# Patient Record
Sex: Female | Born: 1990 | Race: Black or African American | Hispanic: No | Marital: Single | Smoking: Never smoker
Health system: Southern US, Community
[De-identification: ages and names within clinical notes are randomized; demographics above are authoritative.]

## PROBLEM LIST (undated history)

## (undated) DIAGNOSIS — F3281 Premenstrual dysphoric disorder: Secondary | ICD-10-CM

## (undated) DIAGNOSIS — J45909 Unspecified asthma, uncomplicated: Secondary | ICD-10-CM

## (undated) HISTORY — PX: DILATION AND CURETTAGE OF UTERUS: SHX78

---

## 2011-07-28 ENCOUNTER — Other Ambulatory Visit (HOSPITAL_COMMUNITY)
Admission: RE | Admit: 2011-07-28 | Discharge: 2011-07-28 | Disposition: A | Discharge status: Home / Self Care | Payer: PRIVATE HEALTH INSURANCE | Source: Ambulatory Visit | Attending: Obstetrics and Gynecology | Admitting: Obstetrics and Gynecology

## 2011-07-28 DIAGNOSIS — Z113 Encounter for screening for infections with a predominantly sexual mode of transmission: Secondary | ICD-10-CM | POA: Insufficient documentation

## 2011-07-28 DIAGNOSIS — Z124 Encounter for screening for malignant neoplasm of cervix: Secondary | ICD-10-CM | POA: Insufficient documentation

## 2014-03-01 ENCOUNTER — Emergency Department (HOSPITAL_BASED_OUTPATIENT_CLINIC_OR_DEPARTMENT_OTHER): Payer: No Typology Code available for payment source

## 2014-03-01 ENCOUNTER — Encounter (HOSPITAL_BASED_OUTPATIENT_CLINIC_OR_DEPARTMENT_OTHER): Payer: Self-pay | Admitting: *Deleted

## 2014-03-01 ENCOUNTER — Emergency Department (HOSPITAL_BASED_OUTPATIENT_CLINIC_OR_DEPARTMENT_OTHER)
Admission: EM | Admit: 2014-03-01 | Discharge: 2014-03-01 | Disposition: A | Discharge status: Home / Self Care | Payer: No Typology Code available for payment source | Attending: Emergency Medicine | Admitting: Emergency Medicine

## 2014-03-01 DIAGNOSIS — Y998 Other external cause status: Secondary | ICD-10-CM | POA: Insufficient documentation

## 2014-03-01 DIAGNOSIS — Y92481 Parking lot as the place of occurrence of the external cause: Secondary | ICD-10-CM | POA: Insufficient documentation

## 2014-03-01 DIAGNOSIS — S43402A Unspecified sprain of left shoulder joint, initial encounter: Secondary | ICD-10-CM

## 2014-03-01 DIAGNOSIS — Y9389 Activity, other specified: Secondary | ICD-10-CM | POA: Diagnosis not present

## 2014-03-01 DIAGNOSIS — S39012A Strain of muscle, fascia and tendon of lower back, initial encounter: Secondary | ICD-10-CM | POA: Diagnosis not present

## 2014-03-01 DIAGNOSIS — S43409A Unspecified sprain of unspecified shoulder joint, initial encounter: Secondary | ICD-10-CM | POA: Insufficient documentation

## 2014-03-01 DIAGNOSIS — S199XXA Unspecified injury of neck, initial encounter: Secondary | ICD-10-CM | POA: Diagnosis present

## 2014-03-01 DIAGNOSIS — S161XXA Strain of muscle, fascia and tendon at neck level, initial encounter: Secondary | ICD-10-CM | POA: Insufficient documentation

## 2014-03-01 DIAGNOSIS — R52 Pain, unspecified: Secondary | ICD-10-CM

## 2014-03-01 MED ORDER — HYDROCODONE-ACETAMINOPHEN 5-325 MG PO TABS
1.0000 | ORAL_TABLET | Freq: Once | ORAL | Status: AC
  Administered 2014-03-01: 1 via ORAL
  Filled 2014-03-01: qty 1

## 2014-03-01 MED ORDER — HYDROCODONE-ACETAMINOPHEN 5-325 MG PO TABS: ORAL_TABLET | ORAL | Status: DC
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
## 2014-03-01 NOTE — ED Notes (Signed)
Pt's heart rate was 48 on d/c. PA updated and an EKG ordered. Primary nurse updated.

## 2014-03-01 NOTE — Discharge Instructions (Signed)
Take vicodin for breakthrough pain, do not drink alcohol, drive, care for children or do other critical tasks while taking vicodin. ° °Do not hesitate to return to the emergency room for any new, worsening or concerning symptoms. ° °Please obtain primary care using resource guide below. But the minute you were seen in the emergency room and that they will need to obtain records for further outpatient management. ° ° °Emergency Department Resource Guide °1) Find a Doctor and Pay Out of Pocket °Although you won't have to find out who is covered by your insurance plan, it is a good idea to ask around and get recommendations. You will then need to call the office and see if the doctor you have chosen will accept you as a new patient and what types of options they offer for patients who are self-pay. Some doctors offer discounts or will set up payment plans for their patients who do not have insurance, but you will need to ask so you aren't surprised when you get to your appointment. ° °2) Contact Your Local Health Department °Not all health departments have doctors that can see patients for sick visits, but many do, so it is worth a call to see if yours does. If you don't know where your local health department is, you can check in your phone book. The CDC also has a tool to help you locate your state's health department, and many state websites also have listings of all of their local health departments. ° °3) Find a Walk-in Clinic °If your illness is not likely to be very severe or complicated, you may want to try a walk in clinic. These are popping up all over the country in pharmacies, drugstores, and shopping centers. They're usually staffed by nurse practitioners or physician assistants that have been trained to treat common illnesses and complaints. They're usually fairly quick and inexpensive. However, if you have serious medical issues or chronic medical problems, these are probably not your best option. ° °No  Primary Care Doctor: °- Call [facility] at  832-8000 - they can help you locate a primary care doctor that  accepts your insurance, provides certain services, etc. °- Physician Referral Service- 1-800-533-3463 ° °Chronic Pain Problems: °Organization         Address  Phone   Notes  °[facility]  (336) 297-2271 Patients need to be referred by their primary care doctor.  ° °Medication Assistance: °Organization         Address  Phone   Notes  °[place] [facility] [address] °[place], [place] [address] (336) 641-8030 --Must be a resident of [place] °-- Must have NO insurance coverage whatsoever (no Medicaid/ Medicare, etc.) °-- The pt. MUST have a primary care doctor that directs their care regularly and follows them in the community °  °[facility]  (866) 331-1348   °[employer]  (888) 892-1162   ° °Agencies that provide inexpensive medical care: °Organization         Address  Phone   Notes  °[facility]  (336) 832-8035   °[facility]    (336) 832-7272   °[facility] [address] °[place], [place] [address] (336) 832-4777   °[facility] [address], °[place] (336) 271-4999   °[facility]    (336) 373-0678   °[facility]    (336) 272-1050   °[facility] °   Center ° [address], [place] Phone:  (336) 832-4444, Fax:  (336) 832-4440 Hours of Operation:  9 am - 6 pm, M-F.  Also accepts Medicaid/Medicare and self-pay.  °[facility] ° [address], [place] Phone: (336) 832-3150, Fax: (336) 832-3151. Hours of Operation:  8:30 am - 5:30 pm, M-F.  Also accepts Medicaid and self-pay.  °[facility] [place] [address], [place] Phone: (336) 878-6027   °[facility] [address], [place], [place] (336)723-1848, Ext. 123 Mondays & Thursdays: 7-9 AM.  First 15  patients are seen on a first come, first serve basis. °  ° °Medicaid-accepting [place] Providers: ° °Organization         Address  Phone   Notes  °[facility] [address], [place] (336) 641-2100 Also accepts self-pay patients.  °[facility] [address], [place] ° (336) 856-9996   °[facility] [address], [place] (336) 288-8857   °[facility] [address] [place] [address], [place] (336) 299-7000   °Veita Bland [address], [place]  ° (336) 373-1557 Only accepts [place] Access Medicaid patients after they have their name applied to their card.  ° °Self-Pay (no insurance) in [place]: ° °Organization         Address  Phone   Notes  °Sickle Cell Patients, [facility] [address], [place] (336) 832-1970   °[facility] [address], [place] (336) 832-4400   °[facility] ° [address], [place] (336) 992-4800   °[facility]/Dr. Osei-Bonsu ° [address] [place] [address], [place] or [address], [place] (336) 841-8500 Phone number for both [place] and [place] locations is the same.  °[facility] [address], [place] (336) 299-0000   °[facility] [address] [place] [address], [place] or [address] (336) 852-7530 °(336) 878-2260   °[facility] [address], [place] (336) 350-1642, phone; (336) 294-5005, fax Sees patients 1st and 3rd Saturday of every month.  Must not qualify for public or private insurance (i.e. Medicaid, Medicare, [place] Health Choice, Veterans' Benefits) • Household income should be no more than 200% of the poverty level •The clinic cannot treat you if you are pregnant or think you are pregnant • Sexually transmitted diseases are not treated at the clinic.  ° ° °Dental  Care: °Organization         Address  Phone  Notes  °[place] [facility] [address], [place] (336) 641-6152 Accepts children up to age 21 who are enrolled in Medicaid or [place] Health Choice; pregnant women with a Medicaid card; and children who have applied for Medicaid or [place] Health Choice, but were declined, whose parents can pay a reduced fee at time of service.  °[place] [facility] [place]  [address], [place] (336) 641-7733 Accepts children up to age 21 who are enrolled in Medicaid or [place] Health Choice; pregnant women with a Medicaid card; and children who have applied for Medicaid or [place] Health Choice, but were declined, whose parents can pay a reduced fee at time of service.  °[facility] ° [address], [place] (  336) Z1729269 Patients are seen by appointment only. Walk-ins are not accepted. [employer] will see patients 71 years of age and older. Monday - Tuesday (8am-5pm) Most Wednesdays (8:30-5pm) $30 per visit, cash only  [facility]  [address], [facility] [phone] Patients are seen by appointment only. Walk-ins are not accepted. [place] will see patients 37 years of age and older. One Wednesday Evening (Monthly: Volunteer Based).  $30 per visit, cash only  [place]  [phone] for adults; Children under age 71, call Graduate Pediatric Dentistry at [phone]. Children aged 47-14, please call [phone] to request a pediatric application.  Dental services are provided in all areas of dental care including fillings, crowns and bridges, complete and partial dentures, implants, gum treatment, root canals, and extractions. Preventive care is also provided. Treatment is provided to both adults and children. Patients are selected via a lottery and there is often a waiting list.   [facility] [address] [place]  930-597-6079 [email or web address]   [facility] [address] [place], [place] (906)436-1678, Ext. 123 Second and Fourth Thursday of each month, opens at 6:30 AM; Clinic ends at 9 AM.  Patients are seen on a first-come first-served basis, and a limited number are seen during each clinic.   [facility]  [address] [place], [place] (854) 120-2008   Eligibility Requirements You must have lived in [place], [place], or [place] counties for at least the last three months.   You cannot be eligible for state or federal sponsored [employer], including [employer], [place], or [employer].   You generally cannot be eligible for healthcare insurance through your employer.    How to apply: Eligibility screenings are held every Tuesday and Wednesday afternoon from 1:00 pm until 4:00 pm. You do not need an appointment for the interview!  [facility] [address] [place], [place]   [place]  [place] Department  [place]  (609)[phone]    Behavioral Health Resources in the Community: Intensive Outpatient Programs Organization         Address  Phone  Notes  [facility] [address], [place], [place] 567-794-6655   [facility] [address] [place], [place]   [facility] [address], [place], [place]   [place] [address]  [place], [place] or 407-405-5122   Substance Abuse Resources Organization         Address  Phone  Notes  [facility]  205-726-1914   [place]  (402) 119-0351   [facility] [place]   Chinita Pester  970-087-7059   Residential & Outpatient Substance Abuse Program  (760) 636-7012    Psychological Services Organization         Address  Phone  Notes  [facility] [place]  [place]  4128351847   [place] [address], [place] or (340)111-7435    Mobile Crisis Teams Organization         Address  Phone  Notes  [facility], Mobile Crisis Care Unit  580-881-9012   [facility]  [address] [place], [place]   [place] [address] °[place] 336-554-5454   ° °Self-Help/Support Groups °Organization         Address  Phone             Notes  °[facility] [place] - variety of support groups  336- 373-1402 Call for more information  °Narcotics Anonymous (NA), [facility] [address], °[place]  2 meetings at this location  ° °Residential Treatment Programs °Organization         Address  Phone  Notes  °[facility] [address],    °[place]  1-866-801-8205   °[facility] ° [address], [place], [place] 704-293-8524   °[facility] [address], [place] 336-845-3988 Admissions: 8am-3pm M-F  °[facility] [address]    °[place], [place] 336-841-1104   °[facility] [address], [place], [place] 336-379-7146   °[facility] [address]  °[place], [place] 336-285-9073   °[facility] - Intensive Outpatient [address], , [place] 336-852-3033   °[facility] [address]  °[place], [place] 1-877-615-2722 or 336-784-9470   °[facility] ([facility]) [address] , [place] 336-227-7417 Accepts Medicaid  °[facility] [address]  ° [place] 1-800-659-3381 Substance Abuse/Addiction Treatment  ° °[place] Behavioral Health Resources °Organization         Address  Phone  Notes  °[facility]  (888) 581-9988   °Julie Brannon, PhD [address] [place], [place]   (336) 349-5553 or (336) 951-0000   ° Behavioral   [address] °[place], [place] (336) 349-4454   °[facility] [address], [place], [place] (336) 342-8316 Insurance/Medicaid/sponsorship through [facility]  °[facility] [address]                                    [place], [place] (336) 342-8316 Therapy/tele-psych/case  °[facility] [address]  ° [place], [place] (336) 349-2233    °Dr. Arfeen  (336) 349-4544   °[facility] [place]  [employer] [place] [facility] 1) [address], [place] °2) [address], [place] °3)  [address], [place] (336) 349-3220 °(336) 342-7768 ° °(336) 342-8140   °[place] Child Abuse Hotline (336) 342-1394 or (336) 342-3537 (After Hours)    ° ° ° ° °

## 2014-03-01 NOTE — ED Notes (Signed)
Pt at window finishing registration process.

## 2014-03-01 NOTE — ED Notes (Signed)
PA states ok to d/c patient home after EKG
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
## 2014-06-12 ENCOUNTER — Ambulatory Visit: Payer: PRIVATE HEALTH INSURANCE | Admitting: Physical Therapy

## 2014-06-18 ENCOUNTER — Ambulatory Visit: Payer: PRIVATE HEALTH INSURANCE | Attending: Chiropractic Medicine

## 2014-11-20 ENCOUNTER — Emergency Department (HOSPITAL_BASED_OUTPATIENT_CLINIC_OR_DEPARTMENT_OTHER)
Admission: EM | Admit: 2014-11-20 | Discharge: 2014-11-20 | Disposition: A | Discharge status: Home / Self Care | Payer: PRIVATE HEALTH INSURANCE | Attending: Emergency Medicine | Admitting: Emergency Medicine

## 2014-11-20 ENCOUNTER — Encounter (HOSPITAL_BASED_OUTPATIENT_CLINIC_OR_DEPARTMENT_OTHER): Payer: Self-pay | Admitting: *Deleted

## 2014-11-20 ENCOUNTER — Emergency Department (HOSPITAL_BASED_OUTPATIENT_CLINIC_OR_DEPARTMENT_OTHER): Payer: PRIVATE HEALTH INSURANCE

## 2014-11-20 DIAGNOSIS — R0789 Other chest pain: Secondary | ICD-10-CM

## 2014-11-20 DIAGNOSIS — R079 Chest pain, unspecified: Secondary | ICD-10-CM

## 2014-11-20 DIAGNOSIS — J45901 Unspecified asthma with (acute) exacerbation: Secondary | ICD-10-CM | POA: Insufficient documentation

## 2014-11-20 DIAGNOSIS — R001 Bradycardia, unspecified: Secondary | ICD-10-CM | POA: Insufficient documentation

## 2014-11-20 HISTORY — DX: Unspecified asthma, uncomplicated: J45.909

## 2014-11-20 LAB — BASIC METABOLIC PANEL
Anion gap: 7 (ref 5–15)
BUN: 11 mg/dL (ref 6–20)
CO2: 27 mmol/L (ref 22–32)
Calcium: 9.6 mg/dL (ref 8.9–10.3)
Chloride: 105 mmol/L (ref 101–111)
Creatinine, Ser: 0.81 mg/dL (ref 0.44–1.00)
GFR calc Af Amer: >60 mL/min (ref >60)
GFR calc non Af Amer: >60 mL/min (ref >60)
Glucose, Bld: 90 mg/dL (ref 65–99)
Potassium: 4.4 mmol/L (ref 3.5–5.1)
Sodium: 139 mmol/L (ref 135–145)

## 2014-11-20 LAB — CBC WITH DIFFERENTIAL/PLATELET
Basophils Absolute: 0 10*3/uL (ref 0.0–0.1)
Basophils Relative: 0 %
Eosinophils Absolute: 0.1 10*3/uL (ref 0.0–0.7)
Eosinophils Relative: 2 %
HCT: 38.9 % (ref 36.0–46.0)
Hemoglobin: 12.8 g/dL (ref 12.0–15.0)
Lymphocytes Relative: 61 %
Lymphs Abs: 2.4 10*3/uL (ref 0.7–4.0)
MCH: 30.3 pg (ref 26.0–34.0)
MCHC: 32.9 g/dL (ref 30.0–36.0)
MCV: 92.2 fL (ref 78.0–100.0)
Monocytes Absolute: 0.4 10*3/uL (ref 0.1–1.0)
Monocytes Relative: 9 %
Neutro Abs: 1.1 10*3/uL — ABNORMAL LOW (ref 1.7–7.7)
Neutrophils Relative %: 28 %
Platelets: 119 10*3/uL — ABNORMAL LOW (ref 150–400)
RBC: 4.22 MIL/uL (ref 3.87–5.11)
RDW: 13.4 % (ref 11.5–15.5)
WBC: 4 10*3/uL (ref 4.0–10.5)

## 2014-11-20 LAB — TROPONIN I: Troponin I: <0.03 ng/mL (ref <0.031)

## 2014-11-20 MED ORDER — HYDROCODONE-ACETAMINOPHEN 5-325 MG PO TABS
1.0000 | ORAL_TABLET | Freq: Once | ORAL | Status: AC
  Administered 2014-11-20: 1 via ORAL
  Filled 2014-11-20: qty 1

## 2014-11-20 MED ORDER — HYDROCODONE-ACETAMINOPHEN 5-325 MG PO TABS: 1.0000 | ORAL_TABLET | Freq: Four times a day (QID) | ORAL | Status: DC | PRN

## 2014-11-20 MED ORDER — IBUPROFEN 800 MG PO TABS: 800.0000 mg | ORAL_TABLET | Freq: Three times a day (TID) | ORAL | Status: DC

## 2014-11-20 MED ORDER — KETOROLAC TROMETHAMINE 30 MG/ML IJ SOLN
30.0000 mg | Freq: Once | INTRAMUSCULAR | Status: DC
  Filled 2014-11-20: qty 1

## 2014-11-20 NOTE — ED Provider Notes (Signed)
CSN: [id number]     Arrival date & time 11/20/14  1058 History   First MD Initiated Contact with Patient 11/20/14 1130     Chief Complaint  Patient presents with  . Chest Pain  . Shortness of Breath     (Consider location/radiation/quality/duration/timing/severity/associated sxs/prior Treatment) HPI  24 year old female with a history of asthma presents with midsternal chest pain that has been on and off for the past 1 week. Became worse and more constant last night, even worse this morning. Had to leave work due to the pain. Has been trying ibuprofen and naproxen with no relief. Patient denies any fevers. Developed a cough yesterday, no production. Denies any leg pain or leg swelling. Pain is not worse with exertion. There is associated shortness of breath as well as pain with inspiration. Lifting her breast up with her hands is the only thing that seems to make the pain better. Lying flat or sitting up does not change the pain. No radiation of pain. Pain is rated as severe. She has no history of hypertension, diabetes, hyperlipidemia, or smoking. Her mom did develop heart disease in her 68s. No recent immobilization. Not on birth control/estrogen  Past Medical History  Diagnosis Date  . Asthma    History reviewed. No pertinent past surgical history. No family history on file. Social History  Substance Use Topics  . Smoking status: Never Smoker   . Smokeless tobacco: Never Used  . Alcohol Use: Yes     Comment: occasional   OB History    No data available     Review of Systems  Constitutional: Negative for fever.  Respiratory: Positive for cough and shortness of breath.   Cardiovascular: Positive for chest pain. Negative for leg swelling.  Gastrointestinal: Negative for vomiting.  All other systems reviewed and are negative.     Allergies  Review of patient's allergies indicates no known allergies.  Home Medications   Prior to Admission medications   Medication Sig  Start Date End Date Taking? Authorizing Provider  HYDROcodone-acetaminophen (NORCO/VICODIN) 5-325 MG per tablet Take 1-2 tablets by mouth every 6 hours as needed for pain and/or cough. 03/01/14   Nicole Pisciotta, PA-C   BP 136/73 mmHg  Pulse 50  Temp(Src) 97.7 F (36.5 C) (Oral)  Resp 18  Wt 271 lb (122.925 kg)  SpO2 100% Physical Exam  Constitutional: She is oriented to person, place, and time. She appears well-developed and well-nourished.  HENT:  Head: Normocephalic and atraumatic.  Right Ear: External ear normal.  Left Ear: External ear normal.  Nose: Nose normal.  Eyes: Right eye exhibits no discharge. Left eye exhibits no discharge.  Cardiovascular: Regular rhythm and normal heart sounds.  Bradycardia present.   Pulmonary/Chest: Effort normal and breath sounds normal. She exhibits tenderness.    Point tender just right of lower sternum  Abdominal: Soft. There is no tenderness.  Neurological: She is alert and oriented to person, place, and time.  Skin: Skin is warm and dry.  Nursing note and vitals reviewed.   ED Course  Procedures (including critical care time) Labs Review Labs Reviewed  CBC WITH DIFFERENTIAL/PLATELET - Abnormal; Notable for the following:    Platelets 119 (*)    Neutro Abs 1.1 (*)    All other components within normal limits  TROPONIN I  BASIC METABOLIC PANEL  CBC WITH DIFFERENTIAL/PLATELET    Imaging Review Dg Chest 2 View  11/20/2014  CLINICAL DATA:  Central chest pain EXAM: CHEST  2 VIEW COMPARISON:  03/01/2014 FINDINGS: The heart size and mediastinal contours are within normal limits. Both lungs are clear. The visualized skeletal structures are unremarkable. IMPRESSION: No active cardiopulmonary disease. Electronically Signed   By: Marlan Palauharles  Clark M.D.   On: 11/20/2014 12:01   I have personally reviewed and evaluated these images and lab results as part of my medical decision-making.   EKG Interpretation None     ED ECG REPORT   Date:  11/20/2014  Rate: 50  Rhythm: sinus bradycardia  QRS Axis: normal  Intervals: normal  ST/T Wave abnormalities: normal  Conduction Disutrbances:none  Narrative Interpretation:   Old EKG Reviewed: unchanged  I have personally reviewed the EKG tracing and agree with the computerized printout as noted.  MDM   Final diagnoses:  Chest pain of uncertain etiology    Patient's CP is likely chest wall in origin. Low risk for ACS (HEART score 1), benign ECG and negative troponin. Low risk for PE. PERC is 0. Do not feel further workup indicated. Will treat with NSAIDs and short course of narcotics. Discussed strict return precautions.    Pricilla LovelessScott Haylyn Halberg, MD 11/20/14 [phone]

## 2014-11-20 NOTE — ED Notes (Signed)
Pain in the center of her chest for a week. States it is hard to breathe. She is crying at triage.

## 2014-11-20 NOTE — ED Notes (Signed)
Patient transported to x-ray. ?

## 2014-11-20 NOTE — Discharge Instructions (Signed)

## 2014-11-20 NOTE — ED Notes (Signed)
Pt reports intermittent mid sternal chest pressure x 1 week.

## 2014-12-22 ENCOUNTER — Encounter (HOSPITAL_BASED_OUTPATIENT_CLINIC_OR_DEPARTMENT_OTHER): Payer: Self-pay | Admitting: *Deleted

## 2014-12-22 ENCOUNTER — Emergency Department (HOSPITAL_BASED_OUTPATIENT_CLINIC_OR_DEPARTMENT_OTHER)
Admission: EM | Admit: 2014-12-22 | Discharge: 2014-12-22 | Discharge status: Against Medical Advice | Payer: Commercial Managed Care - PPO | Attending: Emergency Medicine | Admitting: Emergency Medicine

## 2014-12-22 DIAGNOSIS — N640 Fissure and fistula of nipple: Secondary | ICD-10-CM | POA: Diagnosis not present

## 2014-12-22 DIAGNOSIS — K0889 Other specified disorders of teeth and supporting structures: Secondary | ICD-10-CM | POA: Insufficient documentation

## 2014-12-22 DIAGNOSIS — J45909 Unspecified asthma, uncomplicated: Secondary | ICD-10-CM | POA: Insufficient documentation

## 2014-12-22 MED ORDER — DIPHENHYDRAMINE HCL 25 MG PO CAPS: 25.0000 mg | ORAL_CAPSULE | Freq: Once | ORAL | Status: DC
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
## 2014-12-22 NOTE — ED Notes (Signed)
Dental pain x for 2 weeks on and off. She has several broken teeth. She was seen by a dentist and he would not pull the broken teeth. Her ears also hurt.

## 2015-01-01 ENCOUNTER — Encounter (HOSPITAL_BASED_OUTPATIENT_CLINIC_OR_DEPARTMENT_OTHER): Payer: Self-pay | Admitting: *Deleted

## 2015-01-01 ENCOUNTER — Emergency Department (HOSPITAL_BASED_OUTPATIENT_CLINIC_OR_DEPARTMENT_OTHER)
Admission: EM | Admit: 2015-01-01 | Discharge: 2015-01-01 | Disposition: A | Discharge status: Home / Self Care | Payer: Commercial Managed Care - PPO | Attending: Emergency Medicine | Admitting: Emergency Medicine

## 2015-01-01 DIAGNOSIS — K0381 Cracked tooth: Secondary | ICD-10-CM | POA: Diagnosis not present

## 2015-01-01 DIAGNOSIS — J45909 Unspecified asthma, uncomplicated: Secondary | ICD-10-CM | POA: Diagnosis not present

## 2015-01-01 DIAGNOSIS — Z791 Long term (current) use of non-steroidal anti-inflammatories (NSAID): Secondary | ICD-10-CM | POA: Insufficient documentation

## 2015-01-01 DIAGNOSIS — K0889 Other specified disorders of teeth and supporting structures: Secondary | ICD-10-CM

## 2015-01-01 MED ORDER — TRAMADOL HCL 50 MG PO TABS: 50.0000 mg | ORAL_TABLET | Freq: Four times a day (QID) | ORAL | Status: DC | PRN

## 2015-01-01 MED ORDER — PENICILLIN V POTASSIUM 500 MG PO TABS
500.0000 mg | ORAL_TABLET | Freq: Four times a day (QID) | ORAL | Status: AC
Start: 2015-01-01 — End: 2015-01-08

## 2015-01-01 MED ORDER — OXYCODONE-ACETAMINOPHEN 5-325 MG PO TABS
1.0000 | ORAL_TABLET | Freq: Once | ORAL | Status: AC
  Administered 2015-01-01: 1 via ORAL
  Filled 2015-01-01: qty 1

## 2015-01-01 MED ORDER — IBUPROFEN 600 MG PO TABS: 600.0000 mg | ORAL_TABLET | Freq: Four times a day (QID) | ORAL | Status: DC | PRN

## 2015-01-01 NOTE — ED Notes (Signed)
Dental pain x 3 weeks

## 2015-01-01 NOTE — Discharge Instructions (Signed)
Ibuprofen and tramadol for pain. Penicillin as prescribed until all gone for infection. Follow-up with a dentist  Dental Pain Dental pain may be caused by many things, including:  Tooth decay (cavities or caries). Cavities expose the nerve of your tooth to air and hot or cold temperatures. This can cause pain or discomfort.  Abscess or infection. A dental abscess is a collection of infected pus from a bacterial infection in the inner part of the tooth (pulp). It usually occurs at the end of the tooth's root.  Injury.  An unknown reason (idiopathic). Your pain may be mild or severe. It may only occur when:  You are chewing.  You are exposed to hot or cold temperature.  You are eating or drinking sugary foods or beverages, such as soda or candy. Your pain may also be constant. HOME CARE INSTRUCTIONS Watch your dental pain for any changes. The following actions may help to lessen any discomfort that you are feeling:  Take medicines only as directed by your dentist.  If you were prescribed an antibiotic medicine, finish all of it even if you start to feel better.  Keep all follow-up visits as directed by your dentist. This is important.  Do not apply heat to the outside of your face.  Rinse your mouth or gargle with salt water if directed by your dentist. This helps with pain and swelling.  You can make salt water by adding  tsp of salt to 1 cup of warm water.  Apply ice to the painful area of your face:  Put ice in a plastic bag.  Place a towel between your skin and the bag.  Leave the ice on for 20 minutes, 2-3 times per day.  Avoid foods or drinks that cause you pain, such as:  Very hot or very cold foods or drinks.  Sweet or sugary foods or drinks. SEEK MEDICAL CARE IF:  Your pain is not controlled with medicines.  Your symptoms are worse.  You have new symptoms. SEEK IMMEDIATE MEDICAL CARE IF:  You are unable to open your mouth.  You are having trouble  breathing or swallowing.  You have a fever.  Your face, neck, or jaw is swollen.   This information is not intended to replace advice given to you by your health care provider. Make sure you discuss any questions you have with your health care provider.   Document Released: 12/20/2004 Document Revised: 05/06/2014 Document Reviewed: 12/16/2013 Elsevier Interactive Patient Education 2016 [employer]    Emergency Department Resource Guide 1) Find a Doctor and Pay Out of Pocket Although you won't have to find out who is covered by your insurance plan, it is a good idea to ask around and get recommendations. You will then need to call the office and see if the doctor you have chosen will accept you as a new patient and what types of options they offer for patients who are self-pay. Some doctors offer discounts or will set up payment plans for their patients who do not have insurance, but you will need to ask so you aren't surprised when you get to your appointment.  2) Contact Your Local Health Department Not all health departments have doctors that can see patients for sick visits, but many do, so it is worth a call to see if yours does. If you don't know where your local health department is, you can check in your phone book. The CDC also has a tool to help you locate your  state's health department, and many state websites also have listings of all of their local health departments.  3) Find a Walk-in Clinic If your illness is not likely to be very severe or complicated, you may want to try a walk in clinic. These are popping up all over the country in pharmacies, drugstores, and shopping centers. They're usually staffed by nurse practitioners or physician assistants that have been trained to treat common illnesses and complaints. They're usually fairly quick and inexpensive. However, if you have serious medical issues or chronic medical problems, these are probably not your best option.  No  Primary Care Doctor: - Call Health Connect at  [phone] - they can help you locate a primary care doctor that  accepts your insurance, provides certain services, etc. - Physician Referral Service- [phone]  Chronic Pain Problems: Organization         Address  Phone   Notes  [facility]  [phone] Patients need to be referred by their primary care doctor.   Medication Assistance: Organization         Address  Phone   Notes  [facility] [address] [place], [place] [address] [phone] --Must be a resident of [facility] -- Must have NO insurance coverage whatsoever (no Medicaid/ Medicare, etc.) -- The pt. MUST have a primary care doctor that directs their care regularly and follows them in the community   [facility]ist  [phone]   [employer]  [phone]    Agencies that provide inexpensive medical care: Organization         Address  Phone   Notes  [facility]  [phone]   [facility]    [phone]   [facility] [address] [place], [place] [address] [phone]   [facility] [address] [place], [place] [phone]   Planned Parenthood    [phone]   [facility]    [phone]   [facility]  [address], [place] Phone:  [phone], Fax:  [phone] Hours of Operation:  9 am - 6 pm, M-F.  Also accepts Medicaid/Medicare and self-pay.  [facility]  [address], [place] Phone: [phone], Fax: [phone]. Hours of Operation:  8:30 am - 5:30 pm, M-F.  Also accepts Medicaid and self-pay.  [facility] [place] [address], [place] Phone: [phone]   [facility] [address] Natasha Bence [place], [place] [phone], Ext. [phone] Mondays & Thursdays: 7-9 AM.  First 15 patients are seen on  a first come, first serve basis.    Medicaid-accepting [facility] Providers:  Organization         Address  Phone   Notes  [facility] [address], [place] [phone] Also accepts self-pay patients.  [facility] [address] Laurell Josephs [place], [place]  [phone]   [facility] [address], [place] [phone]   [facility] [address], [place] [phone]   Renaye Rakers [address], [place]   [phone] Only accepts [place] Access [place] patients after they have their name applied to their card.   Self-Pay (no insurance) in [facility]  Organization  Address  Phone   Notes  Sickle Cell Patients, [facility] [place] [phone]   [facility] [place] [phone]   [facility] [place]  [address], [place] [phone]   [facility]/Dr. Osei-Bonsu  [address], [place] or [address], [place] [phone] Phone number for both [place] and [place] locations is the same.  Urgent Medical and [facility] [address] [place] [phone]   [facility] [address], [place] or [address] [phone]   [facility] [address] [place] [phone], phone; [phone], fax Sees patients 1st and 3rd Saturday of every month.  Must not qualify for public or private insurance (i.e. Medicaid, Medicare, [place] Health Choice, Veterans' Benefits)  Household income should be no more than 200% of the poverty level The clinic cannot treat you if you are pregnant or think you are pregnant  Sexually transmitted diseases are not treated at the clinic.    Dental Care: Organization          Address  Phone  Notes  [facility] Department of [facility] [place] [phone] Accepts children up to age 57 who are enrolled in [place] or [place]; pregnant women with a Medicaid card; and children who have applied for Medicaid or [place] Health Choice, but were declined, whose parents can pay a reduced fee at time of service.  [facility] Department of [facility]  [address], [place] [phone] Accepts children up to age 67 who are enrolled in [place] or [place]; pregnant women with a Medicaid card; and children who have applied for Medicaid or [place] Health Choice, but were declined, whose parents can pay a reduced fee at time of service.  [facility]  [place] [phone] Patients are seen by appointment only. Walk-ins are not accepted. [place] will see patients 49 years of age and older. Monday - Tuesday (8am-5pm) Most Wednesdays (8:30-5pm) $30 per visit, cash only  [facility] Adult Dental Access PROGRAM  [address], [facility] [phone] Patients are seen by appointment only. Walk-ins are not accepted. [place] will see patients 38 years of age and older. One Wednesday Evening (Monthly: Volunteer Based).  $30 per visit, cash only  [place]  [phone] for adults; Children under age 59, call Graduate Pediatric Dentistry at [phone]. Children aged 77-14, please call [phone] to request a pediatric application.  Dental services are provided in all areas of dental care including fillings, crowns and bridges, complete and partial dentures, implants, gum treatment, root canals, and extractions. Preventive care is also provided. Treatment is provided to both adults and children. Patients are selected via a lottery and there is often a waiting list.   [facility] [address], [place]  [phone] [email or web address]   [facility] [address] [place], [place] [phone], Ext. [phone] Second and Fourth Thursday of each month, opens at 6:30 AM; Clinic ends at 9 AM.  Patients are seen on a first-come first-served basis, and a limited number are seen during each clinic.   [facility]  [address] [place], [place]  [place], [place] [phone]   Eligibility Requirements You must have lived in [place], [place], or [place] counties for at least the last three months.   You cannot be eligible for state or federal sponsored [employer], including [employer], [place], or [employer].   You generally cannot be eligible for healthcare insurance through your employer.    How to apply: Eligibility screenings are held every Tuesday and Wednesday afternoon from 1:00 pm until 4:00 pm. You do not need an appointment for the interview!  [facility] [address], [place], [place]   [place]  [place] Department  [place]  [phone]    Behavioral Health Resources in the Community: Intensive Outpatient Programs Organization         Address  Phone  Notes  [place]. [address], [place], [place] [phone]   [facility] [address] [place], [place]   ADS: Alcohol & Drug Svcs [address] [place], [place]   [place] [address]  [place], [place] or [phone]   Substance Abuse Resources Organization         Address  Phone  Notes  Alcohol and Drug Services  [phone]   [place]  [phone]   [facility] [place]   Chinita Pester  [phone]   Residential & Outpatient Substance Abuse Program  [phone]   Psychological  Services Organization         Address  Phone  Notes  [facility] [place]  [place]  [phone]   [place] [address] [place] or [phone]    Mobile Crisis Teams Organization         Address  Phone  Notes  [facility], Mobile Crisis Care Unit  [phone]   [facility]  [address] [place], [place]   [place] [address], [place] [phone]    Self-Help/Support Groups Organization         Address  Phone             Notes  [place]. of [place] - variety of support groups  [place] Call for more information  Narcotics Anonymous (NA), [facility] [address], [employer] [place]  2 meetings at this location   Special educational needs teacher         Address  Phone  Notes  [facility]idential [facility] [place],    [place]  [phone]   [facility]  [address] [place] [phone], [place], [place]   [place], [place] [phone] Admissions: 8am-3pm M-F  Incentives Substance [place] [address],    [place], [place] [phone]   [facility] [address] Jadene Pierini [place], [place]   [facility] [address],  [place], [place]   [facility]ight Programs - Intensive Outpatient [address], [place], [place]   [facility] (Centre.) [place].,  [place], [place] or [phone]   [facility] (RTS) [address], [place], [place] Accepts Medicaid  [facility] [place] [address],  [place] [phone] Substance Abuse/Addiction Treatment   [facility] Resources Organization  Address  Phone  Notes  [facility]  [phone]   Domenic Schwab, PhD [address] Arlis Port[place], [place]   [phone] or [phone]   [place], [place] [phone]   [address], [place], [place] [phone] Insurance/Medicaid/sponsorship through [facility] and Families [address] [place]                                    [place], [place] [phone] [place] [address], [place] [phone]    Dr. Adele Schilder  [phone]   [facility] [place] Dept. 1) [address], [place] 2) [place] 3)  [place] [address], [place] [phone]  [phone]   [place] [phone] or [phone] (After Hours)

## 2015-01-01 NOTE — ED Provider Notes (Signed)
CSN: [id number]     Arrival date & time 01/01/15  1646 History   First MD Initiated Contact with Patient 01/01/15 1910     Chief Complaint  Patient presents with  . Dental Pain     (Consider location/radiation/quality/duration/timing/severity/associated sxs/prior Treatment) HPI Kathryn Daniels is a 24 y.o. female with history of asthma, presents to emergency department complaining of dental pain. Patient states her pain started 2 weeks ago. She states she tripped off a tooth, and it has been hurting since. She denies any facial swelling. She denies any fever or chills. She states she went to a dentist but could not pay her bills so they would not do anything for her pain. She states that her insurance does not kick in until March which is 3 months from now. She has been taking ibuprofen for pain with no improvement. She denies any other associated complaints. No facial swelling.  Past Medical History  Diagnosis Date  . Asthma    History reviewed. No pertinent past surgical history. No family history on file. Social History  Substance Use Topics  . Smoking status: Never Smoker   . Smokeless tobacco: Never Used  . Alcohol Use: Yes     Comment: occasional   OB History    No data available     Review of Systems  Constitutional: Negative for fever and chills.  HENT: Positive for dental problem.   Respiratory: Negative for cough, chest tightness and shortness of breath.   Cardiovascular: Negative for chest pain, palpitations and leg swelling.  Musculoskeletal: Negative for myalgias, arthralgias, neck pain and neck stiffness.  Skin: Negative for rash.  Neurological: Negative for dizziness, weakness and headaches.  All other systems reviewed and are negative.     Allergies  Review of patient's allergies indicates no known allergies.  Home Medications   Prior to Admission medications   Medication Sig Start Date End Date Taking? Authorizing Provider  HYDROcodone-acetaminophen  (NORCO) 5-325 MG tablet Take 1-2 tablets by mouth every 6 (six) hours as needed for severe pain. 11/20/14   Pricilla LovelessScott Goldston, MD  ibuprofen (ADVIL,MOTRIN) 800 MG tablet Take 1 tablet (800 mg total) by mouth 3 (three) times daily. 11/20/14   Pricilla LovelessScott Goldston, MD   BP 115/56 mmHg  Pulse 65  Temp(Src) 98.8 F (37.1 C) (Oral)  Resp 18  Ht 6\' 1"  (1.854 m)  Wt 122.925 kg  BMI 35.76 kg/m2  SpO2 100%  LMP 11/19/2014 Physical Exam  Constitutional: She appears well-developed and well-nourished. No distress.  HENT:  Partially avulsed the right upper first molar. Surrounding gum swelling. Tender to palpation. No obvious abscess. No trismus. No swelling of the tongue.  Eyes: Conjunctivae are normal.  Neck: Neck supple.  Neurological: She is alert.  Skin: Skin is warm and dry.  Nursing note and vitals reviewed.   ED Course  Procedures (including critical care time) Labs Review Labs Reviewed - No data to display  Imaging Review No results found. I have personally reviewed and evaluated these images and lab results as part of my medical decision-making.   EKG Interpretation None      MDM   Final diagnoses:  Pain, dental    Patient with ongoing dental pain for 3 weeks. Exam is not concerning for Ludwig's angina. No obvious dental abscess. Will start on penicillin. Ibuprofen and tramadol for pain. Follow-up with a dentist.  Filed Vitals:   01/01/15 1654  BP: 115/56  Pulse: 65  Temp: 98.8 F (37.1 C)  TempSrc: Oral  Resp: 18  Height:  (1.854 m)  Weight: 122.925 kg  SpO2: 100%     Jaynie Crumble, PA-C 01/01/15 2149  Tilden Fossa, MD 01/02/15 [phone]

## 2015-10-29 ENCOUNTER — Encounter (HOSPITAL_BASED_OUTPATIENT_CLINIC_OR_DEPARTMENT_OTHER): Payer: Self-pay

## 2015-10-29 ENCOUNTER — Emergency Department (HOSPITAL_BASED_OUTPATIENT_CLINIC_OR_DEPARTMENT_OTHER)
Admission: EM | Admit: 2015-10-29 | Discharge: 2015-10-29 | Disposition: A | Discharge status: Home / Self Care | Payer: Commercial Managed Care - PPO | Attending: Emergency Medicine | Admitting: Emergency Medicine

## 2015-10-29 DIAGNOSIS — J45909 Unspecified asthma, uncomplicated: Secondary | ICD-10-CM | POA: Diagnosis not present

## 2015-10-29 DIAGNOSIS — B354 Tinea corporis: Secondary | ICD-10-CM | POA: Insufficient documentation

## 2015-10-29 DIAGNOSIS — R21 Rash and other nonspecific skin eruption: Secondary | ICD-10-CM | POA: Diagnosis present

## 2015-10-29 MED ORDER — CLOTRIMAZOLE 1 % EX CREA: TOPICAL_CREAM | CUTANEOUS | 1 refills | Status: DC

## 2015-10-29 NOTE — Discharge Instructions (Signed)
Do not hesitate to return to the emergency room for any new, worsening or concerning symptoms. ° °Please obtain primary care using resource guide below. Let them know that you were seen in the emergency room and that they will need to obtain records for further outpatient management. ° ° °

## 2015-10-29 NOTE — ED Notes (Signed)
PA at bedside.

## 2015-10-29 NOTE — ED Provider Notes (Signed)
[facility] Provider Note   CSN: [id number] Arrival date & time: 10/29/15  1530     History   Chief Complaint Chief Complaint  Patient presents with  . Rash   HPI   Blood pressure 134/70, pulse 61, temperature 98.7 F (37.1 C), temperature source Oral, resp. rate 18, height 6\' 1"  (1.854 m), weight 117.5 kg, SpO2 100 %.  Kathryn Daniels is a 25 y.o. female complaining of pr rash onset 5 days ago to chest and upper arms. States that the lesions occurred after she picked up a stray cat. She was not clotted bitten by the. She has been applying any medication at home.  Past Medical History:  Diagnosis Date  . Asthma     There are no active problems to display for this patient.   History reviewed. No pertinent surgical history.  OB History    No data available       Home Medications    Prior to Admission medications   Medication Sig Start Date End Date Taking? Authorizing Provider  clotrimazole (LOTRIMIN) 1 % cream Apply to affected area 2 times daily for at least 4 weeks ( for 1 week until after the lesions have healed) 10/29/15   Wynetta EmeryNicole Sarahann Horrell, PA-C    Family History No family history on file.  Social History Social History  Substance Use Topics  . Smoking status: Never Smoker  . Smokeless tobacco: Never Used  . Alcohol use Yes     Comment: weekly     Allergies   Review of patient's allergies indicates no known allergies.   Review of Systems Review of Systems  10 systems reviewed and found to be negative, except as noted in the HPI.   Physical Exam Updated Vital Signs BP 134/70 (BP Location: Left Arm)   Pulse 61   Temp 98.7 F (37.1 C) (Oral)   Resp 18   Ht 6\' 1"  (1.854 m)   Wt 117.5 kg   LMP  (LMP Unknown)   SpO2 100%   BMI 34.17 kg/m   Physical Exam  Constitutional: She is oriented to person, place, and time. She appears well-developed and well-nourished. No distress.  HENT:  Head: Normocephalic and atraumatic.    Mouth/Throat: Oropharynx is clear and moist.  Eyes: Conjunctivae and EOM are normal. Pupils are equal, round, and reactive to light.  Neck: Normal range of motion.  Cardiovascular: Normal rate, regular rhythm and intact distal pulses.   Pulmonary/Chest: Effort normal and breath sounds normal. No respiratory distress. She has no wheezes. She has no rales. She exhibits no tenderness.  Abdominal: Soft. She exhibits no distension. There is no tenderness.  Musculoskeletal: Normal range of motion.  Neurological: She is alert and oriented to person, place, and time.  Skin: Rash noted. She is not diaphoretic.  Inhalers slightly scaled lesions with central clearing on upper chest with no warmth, tenderness palpation or discharge.   Psychiatric: She has a normal mood and affect.  Nursing note and vitals reviewed.    ED Treatments / Results  Labs (all labs ordered are listed, but only abnormal results are displayed) Labs Reviewed - No data to display  EKG  EKG Interpretation None       Radiology No results found.  Procedures Procedures (including critical care time)  Medications Ordered in ED Medications - No data to display   Initial Impression / Assessment and Plan / ED Course  I have reviewed the triage vital signs and the nursing notes.  Pertinent  labs & imaging results that were available during my care of the patient were reviewed by me and considered in my medical decision making (see chart for details).  Clinical Course    Vitals:   10/29/15 1537 10/29/15 1538  BP:  134/70  Pulse:  61  Resp:  18  Temp:  98.7 F (37.1 C)  TempSrc:  Oral  SpO2:  100%  Weight: 117.5 kg   Height: 6\' 1"  (1.854 m)     [name] is 25 y.o. female presenting with pruritic rash consistent with tinea corporis.  Evaluation does not show pathology that would require ongoing emergent intervention or inpatient treatment. Pt is hemodynamically stable and mentating appropriately.  Discussed findings and plan with patient/guardian, who agrees with care plan. All questions answered. Return precautions discussed and outpatient follow up given.      Final Clinical Impressions(s) / ED Diagnoses   Final diagnoses:  Tinea corporis    New Prescriptions New Prescriptions   CLOTRIMAZOLE (LOTRIMIN) 1 % CREAM    Apply to affected area 2 times daily for at least 4 weeks ( for 1 week until after the lesions have healed)     Wynetta Emery, PA-C 10/29/15 1603    Linwood Dibbles, MD 10/29/15 [phone]

## 2015-10-29 NOTE — ED Triage Notes (Signed)
C/o rash to neck, shoulder and chest x 1 week-NAD-steady gait

## 2017-05-09 ENCOUNTER — Other Ambulatory Visit: Payer: Self-pay

## 2017-05-09 ENCOUNTER — Emergency Department (HOSPITAL_BASED_OUTPATIENT_CLINIC_OR_DEPARTMENT_OTHER)
Admission: EM | Admit: 2017-05-09 | Discharge: 2017-05-09 | Disposition: A | Discharge status: Home / Self Care | Payer: BLUE CROSS/BLUE SHIELD | Attending: Emergency Medicine | Admitting: Emergency Medicine

## 2017-05-09 ENCOUNTER — Encounter (HOSPITAL_BASED_OUTPATIENT_CLINIC_OR_DEPARTMENT_OTHER): Payer: Self-pay | Admitting: Emergency Medicine

## 2017-05-09 DIAGNOSIS — N946 Dysmenorrhea, unspecified: Secondary | ICD-10-CM | POA: Insufficient documentation

## 2017-05-09 DIAGNOSIS — K920 Hematemesis: Secondary | ICD-10-CM | POA: Diagnosis not present

## 2017-05-09 DIAGNOSIS — J45909 Unspecified asthma, uncomplicated: Secondary | ICD-10-CM | POA: Diagnosis not present

## 2017-05-09 LAB — COMPREHENSIVE METABOLIC PANEL
ALT: 15 U/L (ref 14–54)
AST: 20 U/L (ref 15–41)
Albumin: 4.1 g/dL (ref 3.5–5.0)
Alkaline Phosphatase: 47 U/L (ref 38–126)
Anion gap: 10 (ref 5–15)
BUN: 11 mg/dL (ref 6–20)
CO2: 21 mmol/L — ABNORMAL LOW (ref 22–32)
Calcium: 8.9 mg/dL (ref 8.9–10.3)
Chloride: 106 mmol/L (ref 101–111)
Creatinine, Ser: 0.88 mg/dL (ref 0.44–1.00)
GFR calc Af Amer: >60 mL/min (ref >60)
GFR calc non Af Amer: >60 mL/min (ref >60)
Glucose, Bld: 95 mg/dL (ref 65–99)
Potassium: 3.2 mmol/L — ABNORMAL LOW (ref 3.5–5.1)
Sodium: 137 mmol/L (ref 135–145)
Total Bilirubin: 0.8 mg/dL (ref 0.3–1.2)
Total Protein: 7 g/dL (ref 6.5–8.1)

## 2017-05-09 LAB — CBC WITH DIFFERENTIAL/PLATELET
Basophils Absolute: 0 10*3/uL (ref 0.0–0.1)
Basophils Relative: 0 %
Eosinophils Absolute: 0.1 10*3/uL (ref 0.0–0.7)
Eosinophils Relative: 2 %
HCT: 35.8 % — ABNORMAL LOW (ref 36.0–46.0)
Hemoglobin: 12.4 g/dL (ref 12.0–15.0)
Lymphocytes Relative: 55 %
Lymphs Abs: 2.8 10*3/uL (ref 0.7–4.0)
MCH: 31.8 pg (ref 26.0–34.0)
MCHC: 34.6 g/dL (ref 30.0–36.0)
MCV: 91.8 fL (ref 78.0–100.0)
Monocytes Absolute: 0.4 10*3/uL (ref 0.1–1.0)
Monocytes Relative: 7 %
Neutro Abs: 1.9 10*3/uL (ref 1.7–7.7)
Neutrophils Relative %: 36 %
Platelets: 132 10*3/uL — ABNORMAL LOW (ref 150–400)
RBC: 3.9 MIL/uL (ref 3.87–5.11)
RDW: 13.4 % (ref 11.5–15.5)
WBC: 5.2 10*3/uL (ref 4.0–10.5)

## 2017-05-09 LAB — ABO/RH: ABO/RH(D): O POS

## 2017-05-09 LAB — PREGNANCY, URINE: Preg Test, Ur: NEGATIVE

## 2017-05-09 MED ORDER — TRAMADOL HCL 50 MG PO TABS: 50.0000 mg | ORAL_TABLET | Freq: Four times a day (QID) | ORAL | 0 refills | Status: AC | PRN

## 2017-05-09 MED ORDER — FAMOTIDINE IN NACL 20-0.9 MG/50ML-% IV SOLN
20.0000 mg | Freq: Once | INTRAVENOUS | Status: AC
  Administered 2017-05-09: 20 mg via INTRAVENOUS
  Filled 2017-05-09: qty 50

## 2017-05-09 MED ORDER — PANTOPRAZOLE SODIUM 40 MG PO TBEC
40.0000 mg | DELAYED_RELEASE_TABLET | Freq: Once | ORAL | Status: AC
  Administered 2017-05-09: 40 mg via ORAL
  Filled 2017-05-09: qty 1

## 2017-05-09 MED ORDER — MORPHINE SULFATE (PF) 4 MG/ML IV SOLN
4.0000 mg | Freq: Once | INTRAVENOUS | Status: AC
  Administered 2017-05-09: 4 mg via INTRAVENOUS
  Filled 2017-05-09: qty 1

## 2017-05-09 MED ORDER — ONDANSETRON HCL 4 MG/2ML IJ SOLN
4.0000 mg | Freq: Once | INTRAMUSCULAR | Status: AC
  Administered 2017-05-09: 4 mg via INTRAVENOUS
  Filled 2017-05-09: qty 2

## 2017-05-09 MED ORDER — PANTOPRAZOLE SODIUM 40 MG PO TBEC: 40.0000 mg | DELAYED_RELEASE_TABLET | Freq: Every day | ORAL | 0 refills | Status: AC

## 2017-05-09 MED ORDER — SODIUM CHLORIDE 0.9 % IV BOLUS
1000.0000 mL | Freq: Once | INTRAVENOUS | Status: AC
Start: 2017-05-09 — End: 2017-05-09
  Administered 2017-05-09: 1000 mL via INTRAVENOUS

## 2017-05-09 NOTE — Discharge Instructions (Signed)
Do not take BC, ibuprofen or naproxen - all of them can irritate your stomach. However, it is ok to take acetaminophen (Tylenol).  Return if you are having any further problems.

## 2017-05-09 NOTE — ED Triage Notes (Signed)
"   I started my period yesterday and I started getting cramping around 2000 and then I got sick to my stomach and then I coughed and spit up blood" States was bright red

## 2017-05-09 NOTE — ED Provider Notes (Signed)
[facility] EMERGENCY DEPARTMENT Provider Note   CSN: [id number] Arrival date & time: 05/09/17  0138     History   Chief Complaint Chief Complaint  Patient presents with  . Menstrual Problem    HPI Kathryn Daniels is a 27 y.o. female.  The history is provided by the patient.  She has a history of asthma and comes in because of vomiting blood.  She states her menses started 2 days ago and she had severe pain which she rated at 10/10.  She took 2 doses of BC powder yesterday without any relief.  She then vomited bright red blood.  She thinks it was a lot of blood.  She vomited blood on 3 separate occasions.  She continues to complain of severe lower abdominal pain which she rates at 8/10.  She denies dizziness or lightheadedness.  She denies fever or chills.  She will frequently take ibuprofen or naproxen for menstrual pain, but did not take any of those with this episode.  She denies prior history of ulcer disease or hematemesis.  Past Medical History:  Diagnosis Date  . Asthma     There are no active problems to display for this patient.   History reviewed. No pertinent surgical history.   OB History   None      Home Medications    Prior to Admission medications   Medication Sig Start Date End Date Taking? Authorizing Provider  clotrimazole (LOTRIMIN) 1 % cream Apply to affected area 2 times daily for at least 4 weeks ( for 1 week until after the lesions have healed) 10/29/15   Pisciotta, Mardella Layman    Family History No family history on file.  Social History Social History   Tobacco Use  . Smoking status: Never Smoker  . Smokeless tobacco: Never Used  Substance Use Topics  . Alcohol use: Yes    Comment: weekly  . Drug use: Yes    Types: Marijuana     Allergies   Patient has no known allergies.   Review of Systems Review of Systems  All other systems reviewed and are negative.    Physical Exam Updated Vital Signs BP 127/65 (BP  Location: Right Arm)   Pulse 64   Temp 97.8 F (36.6 C) (Oral)   Resp (!) 22   Ht  (1.803 m)   Wt 117.9 kg (260 lb)   LMP 05/08/2017 (Exact Date)   SpO2 100%   BMI 36.26 kg/m   Physical Exam  Nursing note and vitals reviewed.  27 year old female, resting comfortably and in no acute distress. Vital signs are normal. Oxygen saturation is 100%, which is normal. Head is normocephalic and atraumatic. PERRLA, EOMI. Oropharynx is clear. Neck is nontender and supple without adenopathy or JVD. Back is nontender and there is no CVA tenderness. Lungs are clear without rales, wheezes, or rhonchi. Chest is nontender. Heart has regular rate and rhythm without murmur. Abdomen is soft, flat, with moderate epigastric tenderness and moderate suprapubic tenderness.  There is no rebound or guarding.  There are no masses or hepatosplenomegaly and peristalsis is hypoactive. Extremities have no cyanosis or edema, full range of motion is present. Skin is warm and dry without rash. Neurologic: Mental status is normal, cranial nerves are intact, there are no motor or sensory deficits.  ED Treatments / Results  Labs (all labs ordered are listed, but only abnormal results are displayed) Labs Reviewed  COMPREHENSIVE METABOLIC PANEL - Abnormal; Notable for the following  components:      Result Value   Potassium 3.2 (*)    CO2 21 (*)    All other components within normal limits  CBC WITH DIFFERENTIAL/PLATELET - Abnormal; Notable for the following components:   HCT 35.8 (*)    Platelets 132 (*)    All other components within normal limits  PREGNANCY, URINE  ABO/RH   Procedures Procedures  Medications Ordered in ED Medications  sodium chloride 0.9 % bolus 1,000 mL (0 mLs Intravenous Stopped 05/09/17 0522)  ondansetron (ZOFRAN) injection 4 mg (4 mg Intravenous Given 05/09/17 0419)  morphine 4 MG/ML injection 4 mg (4 mg Intravenous Given 05/09/17 0419)  famotidine (PEPCID) IVPB 20 mg premix (0 mg  Intravenous Stopped 05/09/17 0502)  pantoprazole (PROTONIX) EC tablet 40 mg (40 mg Oral Given 05/09/17 0529)   Initial Impression / Assessment and Plan / ED Course  I have reviewed the triage vital signs and the nursing notes.  Pertinent labs & imaging results that were available during my care of the patient were reviewed by me and considered in my medical decision making (see chart for details).  Hematemesis following 2 doses of BC powder.  Possible gastritis, possible peptic ulcer disease.  Patient states that she had vomited into the trash can, but I see only a very small amount of bright red blood and I am not sure how much actual blood she has vomited.  Will check orthostatic vital signs and CBC.  In the meantime, she is given IV fluids and IV famotidine.  Old records are reviewed, and she has no relevant past visits.  Hemoglobin has had very little change since last value on record from November 2016.  Orthostatic vital signs showed no significant change in pulse or blood pressure. No evidence of significant GI bleed.  She has not had any further vomiting in the ED.  She is discharged with prescriptions for pantoprazole and tramadol, advised to avoid all NSAIDs including BC powder.  Return precautions discussed.  Final Clinical Impressions(s) / ED Diagnoses   Final diagnoses:  Hematemesis with nausea  Menstrual cramps    ED Discharge Orders        Ordered    traMADol (ULTRAM) 50 MG tablet  Every 6 hours PRN     05/09/17 0521    pantoprazole (PROTONIX) 40 MG tablet  Daily     05/09/17 0521       Dione Booze, MD 05/09/17 0532

## 2017-05-09 NOTE — ED Notes (Signed)
Pt/family upset about wait time. Offered visitor warm blanket, coffee, and crackers. Pt denies any needs at this time. Pt asked if she could LWBS. RN instructed patient that she does not have to stay but encouraged pt to be seen by physician if she is concerned about her health. Pt agreeable to continuing to wait.

## 2018-01-08 ENCOUNTER — Encounter (HOSPITAL_BASED_OUTPATIENT_CLINIC_OR_DEPARTMENT_OTHER): Payer: Self-pay | Admitting: *Deleted

## 2018-01-08 ENCOUNTER — Other Ambulatory Visit: Payer: Self-pay

## 2018-01-08 ENCOUNTER — Emergency Department (HOSPITAL_BASED_OUTPATIENT_CLINIC_OR_DEPARTMENT_OTHER)
Admission: EM | Admit: 2018-01-08 | Discharge: 2018-01-08 | Disposition: A | Discharge status: Home / Self Care | Payer: BLUE CROSS/BLUE SHIELD | Attending: Emergency Medicine | Admitting: Emergency Medicine

## 2018-01-08 DIAGNOSIS — J45909 Unspecified asthma, uncomplicated: Secondary | ICD-10-CM | POA: Diagnosis not present

## 2018-01-08 DIAGNOSIS — Z7689 Persons encountering health services in other specified circumstances: Secondary | ICD-10-CM

## 2018-01-08 DIAGNOSIS — N898 Other specified noninflammatory disorders of vagina: Secondary | ICD-10-CM | POA: Diagnosis present

## 2018-01-08 DIAGNOSIS — A6004 Herpesviral vulvovaginitis: Secondary | ICD-10-CM | POA: Insufficient documentation

## 2018-01-08 DIAGNOSIS — Z202 Contact with and (suspected) exposure to infections with a predominantly sexual mode of transmission: Secondary | ICD-10-CM | POA: Diagnosis not present

## 2018-01-08 DIAGNOSIS — B9689 Other specified bacterial agents as the cause of diseases classified elsewhere: Secondary | ICD-10-CM

## 2018-01-08 DIAGNOSIS — Z79899 Other long term (current) drug therapy: Secondary | ICD-10-CM | POA: Diagnosis not present

## 2018-01-08 DIAGNOSIS — N760 Acute vaginitis: Secondary | ICD-10-CM | POA: Diagnosis not present

## 2018-01-08 LAB — URINALYSIS, ROUTINE W REFLEX MICROSCOPIC
Bilirubin Urine: NEGATIVE
Glucose, UA: NEGATIVE mg/dL
Hgb urine dipstick: NEGATIVE
Ketones, ur: NEGATIVE mg/dL
Leukocytes, UA: NEGATIVE
Nitrite: NEGATIVE
Protein, ur: NEGATIVE mg/dL
Specific Gravity, Urine: 1.015 (ref 1.005–1.030)
pH: 7 (ref 5.0–8.0)

## 2018-01-08 LAB — WET PREP, GENITAL
Sperm: NONE SEEN
Trich, Wet Prep: NONE SEEN
Yeast Wet Prep HPF POC: NONE SEEN

## 2018-01-08 LAB — PREGNANCY, URINE: Preg Test, Ur: NEGATIVE

## 2018-01-08 MED ORDER — CEFTRIAXONE SODIUM 250 MG IJ SOLR
250.0000 mg | Freq: Once | INTRAMUSCULAR | Status: AC
  Administered 2018-01-08: 250 mg via INTRAMUSCULAR
  Filled 2018-01-08: qty 250

## 2018-01-08 MED ORDER — VALACYCLOVIR HCL 1 G PO TABS: 1000.0000 mg | ORAL_TABLET | Freq: Two times a day (BID) | ORAL | 0 refills | Status: AC

## 2018-01-08 MED ORDER — METRONIDAZOLE 500 MG PO TABS: 500.0000 mg | ORAL_TABLET | Freq: Two times a day (BID) | ORAL | 0 refills | Status: AC

## 2018-01-08 MED ORDER — AZITHROMYCIN 250 MG PO TABS
1000.0000 mg | ORAL_TABLET | Freq: Once | ORAL | Status: AC
  Administered 2018-01-08: 1000 mg via ORAL
  Filled 2018-01-08: qty 4

## 2018-01-08 NOTE — ED Provider Notes (Signed)
[facility] EMERGENCY DEPARTMENT Provider Note   CSN: [id number] Arrival date & time: 01/08/18  1038   History   Chief Complaint Chief Complaint  Patient presents with  . Vaginal Discharge    HPI Kathryn Daniels is a 28 y.o. female with no significant past medical history who presents for evaluation of vaginal discharge and vaginal lesions.  Patient states she has had a vaginal discharge x2 days.  Patient states this is noted to be thin, white and malodorous.  Patient states she is also noticed to erythematous, painful lesions to her right labia.  States she does not have previous history of herpes.  Patient is concerned for STIs as she had a new sexual encounter approximately 2 weeks ago.  Patient states they did not use protection.  He does not take any birth control.  Denies previous history of STDs.  Rates her vaginal lesion pain a 3/10.  Does not take anything for symptoms.  Patient states last menstrual cycle 01/04/2018.  Denies fever, chills, nausea, vomiting, abdominal pain, pelvic pain, abnormal vaginal bleeding, diarrhea, dysuria.  Denies additional aggravating or alleviating factors.  History provided by patient.  No interpreter was used.  HPI  Past Medical History:  Diagnosis Date  . Asthma     There are no active problems to display for this patient.   History reviewed. No pertinent surgical history.   OB History   No obstetric history on file.      Home Medications    Prior to Admission medications   Medication Sig Start Date End Date Taking? Authorizing Provider  metroNIDAZOLE (FLAGYL) 500 MG tablet Take 1 tablet (500 mg total) by mouth 2 (two) times daily for 7 days. 01/08/18 01/15/18  Dhruti Ghuman A, PA-C  pantoprazole (PROTONIX) 40 MG tablet Take 1 tablet (40 mg total) by mouth daily. 05/09/17   Dione Booze, MD  traMADol (ULTRAM) 50 MG tablet Take 1 tablet (50 mg total) by mouth every 6 (six) hours as needed. 05/09/17   Dione Booze, MD  valACYclovir  (VALTREX) 1000 MG tablet Take 1 tablet (1,000 mg total) by mouth 2 (two) times daily for 10 days. 01/08/18 01/18/18  Leilah Polimeni A, PA-C    Family History History reviewed. No pertinent family history.  Social History Social History   Tobacco Use  . Smoking status: Never Smoker  . Smokeless tobacco: Never Used  Substance Use Topics  . Alcohol use: Yes    Comment: weekly  . Drug use: Yes    Types: Marijuana     Allergies   Patient has no known allergies.   Review of Systems Review of Systems  Constitutional: Negative.   HENT: Negative.   Respiratory: Negative.   Cardiovascular: Negative.   Gastrointestinal: Negative.   Genitourinary: Positive for genital sores and vaginal discharge. Negative for decreased urine volume, difficulty urinating, enuresis, flank pain, frequency, hematuria, menstrual problem, pelvic pain, urgency, vaginal bleeding and vaginal pain.       Right labial pain  Musculoskeletal: Negative.   Skin: Negative.   Neurological: Negative.   All other systems reviewed and are negative.    Physical Exam Updated Vital Signs BP (!) 129/55 (BP Location: Left Arm)   Pulse (!) 56   Temp 97.7 F (36.5 C) (Oral)   Resp 18   Ht 6' (1.829 m)   Wt 122.5 kg   LMP 01/03/2018   SpO2 100%   BMI 36.62 kg/m   Physical Exam Vitals signs and nursing note reviewed.  Constitutional:      General: She is not in acute distress.    Appearance: She is well-developed. She is obese. She is not ill-appearing, toxic-appearing or diaphoretic.  HENT:     Head: Atraumatic.     Mouth/Throat:     Mouth: Mucous membranes are moist.     Pharynx: Oropharynx is clear.  Eyes:     Pupils: Pupils are equal, round, and reactive to light.  Neck:     Musculoskeletal: Normal range of motion.  Cardiovascular:     Rate and Rhythm: Normal rate.     Pulses: Normal pulses.     Heart sounds: Normal heart sounds. No murmur. No friction rub. No gallop.   Pulmonary:     Effort: No  respiratory distress.     Comments: Clear to auscultation bilaterally without wheeze, rhonchi or rales. Abdominal:     General: There is no distension.     Comments: Soft nontender without rebound or guarding.  No CVA tenderness.  Genitourinary:    Comments: Normal appearing external female genitalia without rashes.  Patient does have 2 approximately 3 mm vesicles on erythematous base to right labia minora.  Tender to palpation.  Normal vaginal epithelium. Normal appearing cervix without  petechiae.  Moderate thin, white vaginal discharge in vault.  Cervical os is closed. There is no bleeding noted at the os.  Moderate Odor. Bimanual: No CMT, nontender.  No palpable adnexal masses or tenderness. Uterus midline and not fixed. Rectovaginal exam was deferred.  No cystocele or rectocele noted. No pelvic lymphadenopathy noted. Wet prep was obtained.  Cultures for gonorrhea and chlamydia collected. Exam performed with chaperone in room. Musculoskeletal: Normal range of motion.     Comments: Moves all extremities without difficulty.  Skin:    General: Skin is warm and dry.  Neurological:     Mental Status: She is alert.      ED Treatments / Results  Labs (all labs ordered are listed, but only abnormal results are displayed) Labs Reviewed  WET PREP, GENITAL - Abnormal; Notable for the following components:      Result Value   Clue Cells Wet Prep HPF POC PRESENT (*)    WBC, Wet Prep HPF POC MANY (*)    All other components within normal limits  URINALYSIS, ROUTINE W REFLEX MICROSCOPIC  PREGNANCY, URINE  HIV ANTIBODY (ROUTINE TESTING W REFLEX)  RPR  GC/CHLAMYDIA PROBE AMP ([place]) NOT AT [facility]    EKG None  Radiology No results found.  Procedures Procedures (including critical care time)  Medications Ordered in ED Medications  azithromycin (ZITHROMAX) tablet 1,000 mg (1,000 mg Oral Given 01/08/18 1617)  cefTRIAXone (ROCEPHIN) injection 250 mg (250 mg Intramuscular Given 01/08/18  1617)     Initial Impression / Assessment and Plan / ED Course  I have reviewed the triage vital signs and the nursing notes.  Pertinent labs & imaging results that were available during my care of the patient were reviewed by me and considered in my medical decision making (see chart for details).  28 year old female presents for evaluation of STD exposure.  Has had thin, white malodorous vaginal discharge x2 days.  Is not taking any thing for symptoms PTA.  Patient states she has noticed 2 painful areas to her right labia.  Denies history of herpes.  Afebrile, nonseptic, non-ill-appearing.  Abdomen soft, nontender without rebound or guarding.  Patient does have moderate thin, white malodorous vaginal discharge in vault.  No cervical motion tenderness.  Low  suspicion for PID.  She does have #2-3 mm rounded vesicles on erythematous base to right labia minora.  These are consistent with genital herpes. Will dc with valtrex. Does not have history of this. Has Obgyn f/u appointment tomorrow.  Pt understands that they have GC/Chlamydia cultures pending and that they will need to inform all sexual partners if results return positive. Pt has been treated prophylactically with azithromycin and Rocephin due to pts history, pelvic exam, and wet prep with increased WBCs. Pt not concerning for PID because hemodynamically stable and no cervical motion tenderness on pelvic exam. Pt has also been treated with Flagyl for Bacterial Vaginosis. Pt has been advised to not drink alcohol while on this medication.  Patient to be discharged with instructions to follow up with OBGYN/PCP. Discussed importance of using protection when sexually active.  Patient is hemodynamically stable and appropriate for DC home at this time.  Low suspicion for emergent pathology causing patient's symptoms.  Discussed return precautions.  Patient voiced understanding and is agreeable for follow-up.    Final Clinical Impressions(s) / ED  Diagnoses   Final diagnoses:  BV (bacterial vaginosis)  Herpes simplex vulvovaginitis  Encounter for assessment of STD exposure    ED Discharge Orders         Ordered    metroNIDAZOLE (FLAGYL) 500 MG tablet  2 times daily     01/08/18 1613    valACYclovir (VALTREX) 1000 MG tablet  2 times daily     01/08/18 1613           Majesty Oehlert A, PA-C 01/08/18 1642    Gwyneth SproutPlunkett, Whitney, MD 01/08/18 2204

## 2018-01-08 NOTE — Discharge Instructions (Addendum)
Your evaluated today for vaginal discharge and vaginal lesions.  You do have bacterial vaginosis.  He also have what appears to be a new onset outbreak of herpes.  I will give you an antibiotic for your vaginal discharge as well as an antiviral for your herpes outbreak.  Please do not drink alcohol while taking these medications.  Please follow-up with OB/GYN for reevaluation.  We have treated you with prophylactic medications for gonorrhea and chlamydia.  These results as well as your HIV, syphilis testing are pending at discharge.  You will be called in 24 to 48 hours if these results are positive.  You will need to seek treatment if your HIV or syphilis testings are positive.  Return to the ED for any new or worsening symptoms.

## 2018-01-08 NOTE — ED Triage Notes (Signed)
Pt reports vaginal d/c and "3 bumps" on her labia x 2 days, states she thinks she has an std.

## 2018-01-09 LAB — HIV ANTIBODY (ROUTINE TESTING W REFLEX): HIV Screen 4th Generation wRfx: NONREACTIVE

## 2018-01-09 LAB — GC/CHLAMYDIA PROBE AMP (~~LOC~~) NOT AT ARMC
Chlamydia: NEGATIVE
Neisseria Gonorrhea: NEGATIVE

## 2018-01-09 LAB — RPR: RPR Ser Ql: NONREACTIVE

## 2018-01-11 NOTE — ED Notes (Signed)
01/11/2018, Pt. Called and left a message on this RN's voicemail concerning her STD results.  Reviewed her STD results with her over the phone.  All results were negative.  She is also concerned about the swab of the HSV.   I explained to her that I do not have those results at the present time.  I explained to her that she will get a call if the are positive.

## 2019-09-03 ENCOUNTER — Other Ambulatory Visit: Payer: Self-pay

## 2019-09-03 ENCOUNTER — Encounter (HOSPITAL_BASED_OUTPATIENT_CLINIC_OR_DEPARTMENT_OTHER): Payer: Self-pay | Admitting: *Deleted

## 2019-09-03 ENCOUNTER — Emergency Department (HOSPITAL_BASED_OUTPATIENT_CLINIC_OR_DEPARTMENT_OTHER)
Admission: EM | Admit: 2019-09-03 | Discharge: 2019-09-03 | Disposition: A | Discharge status: Home / Self Care | Payer: BC Managed Care – PPO | Attending: Emergency Medicine | Admitting: Emergency Medicine

## 2019-09-03 DIAGNOSIS — R6889 Other general symptoms and signs: Secondary | ICD-10-CM | POA: Insufficient documentation

## 2019-09-03 DIAGNOSIS — F1290 Cannabis use, unspecified, uncomplicated: Secondary | ICD-10-CM | POA: Diagnosis not present

## 2019-09-03 DIAGNOSIS — J45909 Unspecified asthma, uncomplicated: Secondary | ICD-10-CM | POA: Diagnosis not present

## 2019-09-03 DIAGNOSIS — J069 Acute upper respiratory infection, unspecified: Secondary | ICD-10-CM | POA: Diagnosis present

## 2019-09-03 DIAGNOSIS — R197 Diarrhea, unspecified: Secondary | ICD-10-CM | POA: Diagnosis not present

## 2019-09-03 DIAGNOSIS — Z20822 Contact with and (suspected) exposure to covid-19: Secondary | ICD-10-CM | POA: Diagnosis not present

## 2019-09-03 LAB — SARS CORONAVIRUS 2 BY RT PCR (HOSPITAL ORDER, PERFORMED IN ~~LOC~~ HOSPITAL LAB): SARS Coronavirus 2: NEGATIVE

## 2019-09-03 NOTE — ED Triage Notes (Signed)
C/o covid symptoms x 1 week  

## 2019-09-03 NOTE — ED Provider Notes (Signed)
[facility] EMERGENCY DEPARTMENT Provider Note   CSN: [id number] Arrival date & time: 09/03/19  1211     History Chief Complaint  Patient presents with  . URI    Kathryn Daniels is a 29 y.o. female history of asthma otherwise healthy.  Patient presents today requesting Covid test.  Patient reports that 5-6 days ago she developed fatigue, rhinorrhea, generalized body aches and small amount of nonbloody diarrhea.  These symptoms have been constant since onset, mild, she has been using a BC powder for body aches with some relief.  She describes body aches as aching sensation generalized to her joints and muscles, no focal pain, no radiation of pain improved with BC powder and rest.  She reports she tested positive for COVID-19 in January 2021, symptoms were similar but more mild.  Denies fever, headache, vision changes, neck stiffness, chest pain/shortness of breath, cough/hemoptysis, abdominal pain, vomiting, extremity swelling/color change or any additional concerns.  Patient reports she has not had COVID-19 vaccine.  HPI     Past Medical History:  Diagnosis Date  . Asthma     There are no problems to display for this patient.   History reviewed. No pertinent surgical history.   OB History   No obstetric history on file.     No family history on file.  Social History   Tobacco Use  . Smoking status: Never Smoker  . Smokeless tobacco: Never Used  Substance Use Topics  . Alcohol use: Yes    Comment: weekly  . Drug use: Yes    Types: Marijuana    Home Medications Prior to Admission medications   Medication Sig Start Date End Date Taking? Authorizing Provider  pantoprazole (PROTONIX) 40 MG tablet Take 1 tablet (40 mg total) by mouth daily. 05/09/17   Dione Booze, MD  traMADol (ULTRAM) 50 MG tablet Take 1 tablet (50 mg total) by mouth every 6 (six) hours as needed. 05/09/17   Dione Booze, MD    Allergies    Patient has no known allergies.  Review of  Systems   Review of Systems  Constitutional: Negative for chills and fever.  HENT: Positive for rhinorrhea. Negative for trouble swallowing and voice change.   Respiratory: Negative.  Negative for cough and shortness of breath.   Cardiovascular: Negative.  Negative for chest pain.  Gastrointestinal: Positive for diarrhea. Negative for abdominal pain and vomiting.  Musculoskeletal: Positive for arthralgias and myalgias. Negative for neck pain.  Neurological: Negative.  Negative for weakness and headaches.    Physical Exam Updated Vital Signs BP 127/73 (BP Location: Left Arm)   Pulse (!) 45   Temp 98.1 F (36.7 C) (Oral)   Resp 20   Ht 6\' 1"  (1.854 m)   Wt 136.1 kg   SpO2 100%   BMI 39.58 kg/m   Physical Exam Constitutional:      General: She is not in acute distress.    Appearance: Normal appearance. She is well-developed. She is obese. She is not ill-appearing or diaphoretic.  HENT:     Head: Normocephalic and atraumatic.     Jaw: There is normal jaw occlusion. No trismus.     Right Ear: Tympanic membrane and external ear normal.     Left Ear: Tympanic membrane and external ear normal.     Nose: Rhinorrhea present. Rhinorrhea is clear.     Right Sinus: No maxillary sinus tenderness or frontal sinus tenderness.     Left Sinus: No maxillary sinus tenderness or frontal  sinus tenderness.     Mouth/Throat:     Comments: Mild posterior pharynx cobblestoning and postnasal drip.  The patient has normal phonation and is in control of secretions. No stridor.  Midline uvula without edema. Soft palate rises symmetrically. No tonsillar erythema, swelling or exudates. Tongue protrusion is normal, floor of mouth is soft. No trismus. No creptius on neck palpation. No gingival erythema or fluctuance noted. Mucus membranes moist. Eyes:     General: Vision grossly intact. Gaze aligned appropriately.     Extraocular Movements: Extraocular movements intact.     Conjunctiva/sclera: Conjunctivae  normal.     Pupils: Pupils are equal, round, and reactive to light.  Neck:     Trachea: Trachea and phonation normal. No tracheal tenderness or tracheal deviation.     Meningeal: Brudzinski's sign absent.  Cardiovascular:     Rate and Rhythm: Normal rate and regular rhythm.  Pulmonary:     Effort: Pulmonary effort is normal. No accessory muscle usage or respiratory distress.     Breath sounds: Normal breath sounds and air entry.  Abdominal:     General: There is no distension.     Palpations: Abdomen is soft.     Tenderness: There is no abdominal tenderness. There is no guarding or rebound.  Musculoskeletal:        General: Normal range of motion.     Cervical back: Normal range of motion and neck supple.     Right lower leg: No edema.     Left lower leg: No edema.  Lymphadenopathy:     Cervical: No cervical adenopathy.  Skin:    General: Skin is warm and dry.  Neurological:     Mental Status: She is alert.     GCS: GCS eye subscore is 4. GCS verbal subscore is 5. GCS motor subscore is 6.     Comments: Speech is clear and goal oriented, follows commands Major Cranial nerves without deficit, no facial droop Moves extremities without ataxia, coordination intact  Psychiatric:        Behavior: Behavior normal.     ED Results / Procedures / Treatments   Labs (all labs ordered are listed, but only abnormal results are displayed) Labs Reviewed  SARS CORONAVIRUS 2 BY RT PCR (HOSPITAL ORDER, PERFORMED IN [facility])    EKG None  Radiology No results found.  Procedures Procedures (including critical care time)  Medications Ordered in ED Medications - No data to display  ED Course  I have reviewed the triage vital signs and the nursing notes.  Pertinent labs & imaging results that were available during my care of the patient were reviewed by me and considered in my medical decision making (see chart for details).    MDM Rules/Calculators/A&P                           Additional history obtained from: 1. Nursing notes from this visit. ------------------ 29 year old female well-appearing no acute distress reports around 1 week cough symptoms suggestive of COVID-19.  Rhinorrhea, body aches, fatigue and nonbloody diarrhea.  On exam cranial nerves intact, no meningeal signs, airway clear, no evidence of PTA, RPA, Ludwig's or other deep space infections of the head or neck.  TMs clear bilaterally.  Cardiopulmonary examination unremarkable.  Abdomen soft nontender without peritoneal signs.  Neurovascular intact to all 4 extremities without evidence of DVT.  Covid test obtained and in process should resolve in the next few  hours.  Suspect patient symptoms secondary to viral URI.  She has no evidence of bacterial sinusitis.  Additionally per Centor criteria low likelihood of strep pharyngitis.  No evidence of other bacterial infections at this time requiring antibiotics.  Discussed chest x-ray with patient, she refuses, feel this is reasonable low suspicion for bacterial pneumonia at this time, patient without cough, chest pain or shortness of breath and has clear lung sounds bilaterally.  Additionally patient with small amount of diarrhea suspect secondary to viral illness, abdomen is nontender and patient tolerating p.o. without difficulty doubt diverticulitis, SBO, appendicitis, perforation or other acute intra-abdominal pathologies.  Patient encouraged to maintain water hydration, get plenty of rest and to quarantine and follow-up with her primary care provider this week.  At this time there does not appear to be any evidence of an acute emergency medical condition and the patient appears stable for discharge with appropriate outpatient follow up. Diagnosis was discussed with patient who verbalizes understanding of care plan and is agreeable to discharge. I have discussed return precautions with patient who verbalizes understanding. Patient encouraged to follow-up  with their PCP. All questions answered.  Kathryn Daniels was evaluated in Emergency Department on 09/03/2019 for the symptoms described in the history of present illness. She was evaluated in the context of the global COVID-19 pandemic, which necessitated consideration that the patient might be at risk for infection with the SARS-CoV-2 virus that causes COVID-19. Institutional protocols and algorithms that pertain to the evaluation of patients at risk for COVID-19 are in a state of rapid change based on information released by regulatory bodies including the CDC and federal and state organizations. These policies and algorithms were followed during the patient's care in the ED.  Note: Portions of this report may have been transcribed using voice recognition software. Every effort was made to ensure accuracy; however, inadvertent computerized transcription errors may still be present. Final Clinical Impression(s) / ED Diagnoses Final diagnoses:  Flu-like symptoms  Suspected COVID-19 virus infection    Rx / DC Orders ED Discharge Orders    None       Elizabeth Palau 09/03/19 1756    Little, Ambrose Finland, MD 09/04/19 [id number]

## 2019-09-03 NOTE — Discharge Instructions (Addendum)
At this time there does not appear to be the presence of an emergent medical condition, however there is always the potential for conditions to change. Please read and follow the below instructions.  Please return to the Emergency Department immediately for any new or worsening symptoms. Please be sure to follow up with your Primary Care Provider within one week regarding your visit today; please call their office to schedule an appointment even if you are feeling better for a follow-up visit. Your Covid test is currently in process, it should result in the next 1 day.  Please check your results on your MyChart account and discuss those results with your primary care doctor at your follow-up visit.  You will need to quarantine for 10 days if your test is positive.  Please drink plenty water and get plenty of rest.  You may use over-the-counter anti-inflammatory such as Tylenol as directed on the packaging to help with your symptoms.  Get help right away if: You have trouble breathing. You have a severe headache or a stiff neck. You have severe vomiting or abdominal pain. You have chest pain or shortness of breath You have fever or chills You have any new/concerning or worsening of symptoms.  Please read the additional information packets attached to your discharge summary.  Do not take your medicine if  develop an itchy rash, swelling in your mouth or lips, or difficulty breathing; call 911 and seek immediate emergency medical attention if this occurs.  You may review your lab tests and imaging results in their entirety on your MyChart account.  Please discuss all results of fully with your primary care provider and other specialist at your follow-up visit.  Note: Portions of this text may have been transcribed using voice recognition software. Every effort was made to ensure accuracy; however, inadvertent computerized transcription errors may still be present.

## 2019-10-30 ENCOUNTER — Other Ambulatory Visit: Payer: Self-pay

## 2019-10-30 ENCOUNTER — Emergency Department (HOSPITAL_BASED_OUTPATIENT_CLINIC_OR_DEPARTMENT_OTHER)
Admission: EM | Admit: 2019-10-30 | Discharge: 2019-10-30 | Disposition: A | Discharge status: Home / Self Care | Payer: BC Managed Care – PPO | Attending: Emergency Medicine | Admitting: Emergency Medicine

## 2019-10-30 ENCOUNTER — Encounter (HOSPITAL_BASED_OUTPATIENT_CLINIC_OR_DEPARTMENT_OTHER): Payer: Self-pay

## 2019-10-30 DIAGNOSIS — Z20822 Contact with and (suspected) exposure to covid-19: Secondary | ICD-10-CM | POA: Diagnosis not present

## 2019-10-30 DIAGNOSIS — R197 Diarrhea, unspecified: Secondary | ICD-10-CM | POA: Diagnosis not present

## 2019-10-30 DIAGNOSIS — M791 Myalgia, unspecified site: Secondary | ICD-10-CM | POA: Diagnosis not present

## 2019-10-30 DIAGNOSIS — R070 Pain in throat: Secondary | ICD-10-CM | POA: Diagnosis not present

## 2019-10-30 DIAGNOSIS — R0981 Nasal congestion: Secondary | ICD-10-CM | POA: Diagnosis not present

## 2019-10-30 DIAGNOSIS — J45909 Unspecified asthma, uncomplicated: Secondary | ICD-10-CM | POA: Diagnosis not present

## 2019-10-30 DIAGNOSIS — R110 Nausea: Secondary | ICD-10-CM | POA: Diagnosis not present

## 2019-10-30 DIAGNOSIS — R519 Headache, unspecified: Secondary | ICD-10-CM | POA: Diagnosis not present

## 2019-10-30 HISTORY — DX: Premenstrual dysphoric disorder: F32.81

## 2019-10-30 LAB — RESPIRATORY PANEL BY RT PCR (FLU A&B, COVID)
Influenza A by PCR: NEGATIVE
Influenza B by PCR: NEGATIVE
SARS Coronavirus 2 by RT PCR: NEGATIVE

## 2019-10-30 NOTE — ED Provider Notes (Signed)
[facility] EMERGENCY DEPARTMENT Provider Note   CSN: [id number] Arrival date & time: 10/30/19  1220     History Chief Complaint  Patient presents with  . Generalized Body Aches    Kathryn Daniels is a 29 y.o. female who presents with approximately 1 week full body aches, severe fatigue, congestion, headache, sore throat, nausea, diarrhea.  Denies vomiting, neck pain, blurry vision, double vision, abdominal pain, dysuria, eye pain, lightheadedness.  Denies loss of taste or smell.  Personally reviewed this patient's medical record, she has history of asthma, PMDD. She is not on any medications daily. She states she had COVID-19 in January 2021, she has not been vaccinated.  HPI     Past Medical History:  Diagnosis Date  . Asthma   . PMDD (premenstrual dysphoric disorder)     There are no problems to display for this patient.   History reviewed. No pertinent surgical history.   OB History   No obstetric history on file.     No family history on file.  Social History   Tobacco Use  . Smoking status: Never Smoker  . Smokeless tobacco: Never Used  Vaping Use  . Vaping Use: Never used  Substance Use Topics  . Alcohol use: Yes    Comment: weekly  . Drug use: Yes    Types: Marijuana    Home Medications Prior to Admission medications   Medication Sig Start Date End Date Taking? Authorizing Provider  pantoprazole (PROTONIX) 40 MG tablet Take 1 tablet (40 mg total) by mouth daily. 05/09/17   Dione Booze, MD  traMADol (ULTRAM) 50 MG tablet Take 1 tablet (50 mg total) by mouth every 6 (six) hours as needed. 05/09/17   Dione Booze, MD    Allergies    Patient has no known allergies.  Review of Systems   Review of Systems  Constitutional: Positive for activity change, appetite change, chills and fatigue. Negative for diaphoresis, fever and unexpected weight change.  HENT: Positive for congestion, ear pain, rhinorrhea, sinus pressure and sore throat.  Negative for trouble swallowing and voice change.   Eyes: Negative.   Respiratory: Negative for cough, chest tightness and shortness of breath.   Cardiovascular: Negative for chest pain, palpitations and leg swelling.  Gastrointestinal: Positive for diarrhea and nausea. Negative for abdominal pain and vomiting.  Endocrine: Negative.   Genitourinary: Negative for dysuria, frequency and urgency.  Musculoskeletal: Positive for myalgias. Negative for neck pain and neck stiffness.  Skin: Negative.   Allergic/Immunologic: Negative.   Neurological: Positive for headaches. Negative for dizziness, tremors, weakness and light-headedness.  Hematological: Negative.     Physical Exam Updated Vital Signs BP 127/68 (BP Location: Left Arm)   Pulse 63   Temp 98.4 F (36.9 C) (Oral)   Resp 20   Ht 6\' 1"  (1.854 m)   Wt (!) 154.2 kg   LMP 10/27/2019   SpO2 100%   BMI 44.86 kg/m   Physical Exam Vitals and nursing note reviewed.  HENT:     Head: Normocephalic and atraumatic.     Right Ear: Tympanic membrane, ear canal and external ear normal.     Left Ear: Tympanic membrane, ear canal and external ear normal.     Nose: Congestion and rhinorrhea present.     Mouth/Throat:     Mouth: Mucous membranes are moist.     Tongue: No lesions.     Pharynx: Uvula midline. Posterior oropharyngeal erythema present. No pharyngeal swelling, oropharyngeal exudate or uvula swelling.  Tonsils: No tonsillar exudate or tonsillar abscesses.  Eyes:     General:        Right eye: No discharge.        Left eye: No discharge.     Extraocular Movements: Extraocular movements intact.     Conjunctiva/sclera: Conjunctivae normal.     Pupils: Pupils are equal, round, and reactive to light.  Cardiovascular:     Rate and Rhythm: Normal rate and regular rhythm.     Pulses: Normal pulses.     Heart sounds: Normal heart sounds. No murmur heard.   Pulmonary:     Effort: Pulmonary effort is normal. No tachypnea,  accessory muscle usage or respiratory distress.     Breath sounds: Normal breath sounds. No wheezing or rales.  Chest:     Chest wall: No tenderness, crepitus or edema.  Abdominal:     General: There is no distension.     Palpations: Abdomen is soft.     Tenderness: There is no abdominal tenderness.  Musculoskeletal:        General: No deformity.     Cervical back: Full passive range of motion without pain and neck supple. No rigidity or tenderness.     Right lower leg: No edema.     Left lower leg: No edema.  Skin:    General: Skin is warm and dry.     Capillary Refill: Capillary refill takes less than 2 seconds.     Findings: No ecchymosis, erythema, lesion, petechiae or rash.  Neurological:     General: No focal deficit present.     Mental Status: She is alert and oriented to person, place, and time. Mental status is at baseline.     Sensory: Sensation is intact.     Motor: Motor function is intact.  Psychiatric:        Mood and Affect: Mood normal.     ED Results / Procedures / Treatments   Labs (all labs ordered are listed, but only abnormal results are displayed) Labs Reviewed  RESPIRATORY PANEL BY RT PCR (FLU A&B, COVID)    EKG None  Radiology No results found.  Procedures Procedures (including critical care time)  Medications Ordered in ED Medications - No data to display  ED Course  I have reviewed the triage vital signs and the nursing notes.  Pertinent labs & imaging results that were available during my care of the patient were reviewed by me and considered in my medical decision making (see chart for details).    MDM Rules/Calculators/A&P                           Healthy 29 year old female who presents with concern of 1 week of fatigue, myalgias, congestion.  She has not been vaccinated against COVID-19.   Respiratory pathogen panel negative for COVID-19, influenza A/B.  Patient's vital signs and physical exam are very reassuring.  No further  work-up in the emergency department is necessary at this time.  It is likely she have another viral URI.  Recommend she take over-the-counter analgesia such as Tylenol, ibuprofen as needed.  Kathryn Daniels voiced understanding of her medical evaluation and treatment plan.  Each of her questions were answered to her expressed satisfaction.  Strict return precautions were given patient's vital signs remained stable, patient is stable for discharge.  Final Clinical Impression(s) / ED Diagnoses Final diagnoses:  Myalgia    Rx / DC Orders ED Discharge Orders  None       Sherrilee Gilles 10/30/19 1423    Maia Plan, MD 10/30/19 1447

## 2019-10-30 NOTE — Discharge Instructions (Signed)
You were seen and evaluated in the emergency department today for your body aches, headache, congestion, fatigue.  You tested negative for COVID-19, influenza A/B while in the emergency department. It is likely that you are coming down with a "cold", a viral infection.  You may continue to take Tylenol or ibuprofen at home to help with your symptoms. Encourage you to get plenty of rest and increase your hydration.  Please return to the emergency department immediately if you develop any chest pain, shortness of breath, sudden severe headache, nausea and vomiting that will not stop, or other new severe symptoms.

## 2019-10-30 NOTE — ED Triage Notes (Signed)
Pt c/o "covid" sx x 1 week-states she had covid Jan 2021-NAD-steady gait

## 2020-04-24 ENCOUNTER — Other Ambulatory Visit: Payer: Self-pay

## 2020-04-24 ENCOUNTER — Emergency Department (HOSPITAL_BASED_OUTPATIENT_CLINIC_OR_DEPARTMENT_OTHER): Payer: BC Managed Care – PPO

## 2020-04-24 ENCOUNTER — Emergency Department (HOSPITAL_BASED_OUTPATIENT_CLINIC_OR_DEPARTMENT_OTHER)
Admission: EM | Admit: 2020-04-24 | Discharge: 2020-04-24 | Disposition: A | Discharge status: Home / Self Care | Payer: BC Managed Care – PPO | Attending: Emergency Medicine | Admitting: Emergency Medicine

## 2020-04-24 ENCOUNTER — Encounter (HOSPITAL_BASED_OUTPATIENT_CLINIC_OR_DEPARTMENT_OTHER): Payer: Self-pay | Admitting: Emergency Medicine

## 2020-04-24 DIAGNOSIS — R509 Fever, unspecified: Secondary | ICD-10-CM | POA: Insufficient documentation

## 2020-04-24 DIAGNOSIS — Z3A18 18 weeks gestation of pregnancy: Secondary | ICD-10-CM | POA: Diagnosis not present

## 2020-04-24 DIAGNOSIS — R45851 Suicidal ideations: Secondary | ICD-10-CM | POA: Insufficient documentation

## 2020-04-24 DIAGNOSIS — R000 Tachycardia, unspecified: Secondary | ICD-10-CM | POA: Diagnosis not present

## 2020-04-24 DIAGNOSIS — O039 Complete or unspecified spontaneous abortion without complication: Secondary | ICD-10-CM

## 2020-04-24 DIAGNOSIS — J45909 Unspecified asthma, uncomplicated: Secondary | ICD-10-CM | POA: Diagnosis not present

## 2020-04-24 DIAGNOSIS — Z20822 Contact with and (suspected) exposure to covid-19: Secondary | ICD-10-CM | POA: Insufficient documentation

## 2020-04-24 DIAGNOSIS — R197 Diarrhea, unspecified: Secondary | ICD-10-CM | POA: Diagnosis not present

## 2020-04-24 DIAGNOSIS — R1030 Lower abdominal pain, unspecified: Secondary | ICD-10-CM | POA: Diagnosis not present

## 2020-04-24 DIAGNOSIS — O26892 Other specified pregnancy related conditions, second trimester: Secondary | ICD-10-CM | POA: Diagnosis present

## 2020-04-24 LAB — COMPREHENSIVE METABOLIC PANEL
ALT: 30 U/L (ref 0–44)
AST: 36 U/L (ref 15–41)
Albumin: 3.2 g/dL — ABNORMAL LOW (ref 3.5–5.0)
Alkaline Phosphatase: 46 U/L (ref 38–126)
Anion gap: 9 (ref 5–15)
BUN: 9 mg/dL (ref 6–20)
CO2: 20 mmol/L — ABNORMAL LOW (ref 22–32)
Calcium: 8.3 mg/dL — ABNORMAL LOW (ref 8.9–10.3)
Chloride: 104 mmol/L (ref 98–111)
Creatinine, Ser: 0.85 mg/dL (ref 0.44–1.00)
GFR, Estimated: >60 mL/min (ref >60)
Glucose, Bld: 100 mg/dL — ABNORMAL HIGH (ref 70–99)
Potassium: 3.5 mmol/L (ref 3.5–5.1)
Sodium: 133 mmol/L — ABNORMAL LOW (ref 135–145)
Total Bilirubin: 0.5 mg/dL (ref 0.3–1.2)
Total Protein: 6.7 g/dL (ref 6.5–8.1)

## 2020-04-24 LAB — CBC WITH DIFFERENTIAL/PLATELET
Abs Immature Granulocytes: 0.02 10*3/uL (ref 0.00–0.07)
Basophils Absolute: 0 10*3/uL (ref 0.0–0.1)
Basophils Relative: 0 %
Eosinophils Absolute: 0 10*3/uL (ref 0.0–0.5)
Eosinophils Relative: 0 %
HCT: 37.6 % (ref 36.0–46.0)
Hemoglobin: 12.4 g/dL (ref 12.0–15.0)
Immature Granulocytes: 0 %
Lymphocytes Relative: 5 %
Lymphs Abs: 0.4 10*3/uL — ABNORMAL LOW (ref 0.7–4.0)
MCH: 31.1 pg (ref 26.0–34.0)
MCHC: 33 g/dL (ref 30.0–36.0)
MCV: 94.2 fL (ref 80.0–100.0)
Monocytes Absolute: 0.5 10*3/uL (ref 0.1–1.0)
Monocytes Relative: 7 %
Neutro Abs: 6.5 10*3/uL (ref 1.7–7.7)
Neutrophils Relative %: 88 %
Platelets: 161 10*3/uL (ref 150–400)
RBC: 3.99 MIL/uL (ref 3.87–5.11)
RDW: 13.5 % (ref 11.5–15.5)
WBC: 7.5 10*3/uL (ref 4.0–10.5)
nRBC: 0 % (ref 0.0–0.2)

## 2020-04-24 LAB — WET PREP, GENITAL
Sperm: NONE SEEN
Trich, Wet Prep: NONE SEEN
Yeast Wet Prep HPF POC: NONE SEEN

## 2020-04-24 LAB — URINALYSIS, MICROSCOPIC (REFLEX)

## 2020-04-24 LAB — URINALYSIS, ROUTINE W REFLEX MICROSCOPIC
Bilirubin Urine: NEGATIVE
Glucose, UA: NEGATIVE mg/dL
Ketones, ur: NEGATIVE mg/dL
Leukocytes,Ua: NEGATIVE
Nitrite: NEGATIVE
Protein, ur: NEGATIVE mg/dL
Specific Gravity, Urine: <1.005 — ABNORMAL LOW (ref 1.005–1.030)
pH: 6 (ref 5.0–8.0)

## 2020-04-24 LAB — RESP PANEL BY RT-PCR (FLU A&B, COVID) ARPGX2
Influenza A by PCR: NEGATIVE
Influenza B by PCR: NEGATIVE
SARS Coronavirus 2 by RT PCR: NEGATIVE

## 2020-04-24 LAB — PROTIME-INR
INR: 1 (ref 0.8–1.2)
Prothrombin Time: 13.3 seconds (ref 11.4–15.2)

## 2020-04-24 LAB — PREGNANCY, URINE: Preg Test, Ur: POSITIVE — AB

## 2020-04-24 LAB — LACTIC ACID, PLASMA: Lactic Acid, Venous: 1.4 mmol/L (ref 0.5–1.9)

## 2020-04-24 LAB — LIPASE, BLOOD: Lipase: 28 U/L (ref 11–51)

## 2020-04-24 LAB — APTT: aPTT: 26 seconds (ref 24–36)

## 2020-04-24 LAB — HCG, QUANTITATIVE, PREGNANCY: hCG, Beta Chain, Quant, S: 1026 m[IU]/mL — ABNORMAL HIGH (ref <5)

## 2020-04-24 MED ORDER — METRONIDAZOLE IN NACL 5-0.79 MG/ML-% IV SOLN
500.0000 mg | Freq: Once | INTRAVENOUS | Status: AC
  Administered 2020-04-24: 500 mg via INTRAVENOUS
  Filled 2020-04-24: qty 100

## 2020-04-24 MED ORDER — LACTATED RINGERS IV BOLUS (SEPSIS)
1000.0000 mL | Freq: Once | INTRAVENOUS | Status: AC
Start: 2020-04-24 — End: 2020-04-24
  Administered 2020-04-24: 1000 mL via INTRAVENOUS

## 2020-04-24 MED ORDER — SODIUM CHLORIDE 0.9 % IV SOLN
2.0000 g | Freq: Once | INTRAVENOUS | Status: AC
  Administered 2020-04-24: 2 g via INTRAVENOUS
  Filled 2020-04-24: qty 20

## 2020-04-24 MED ORDER — ACETAMINOPHEN 500 MG PO TABS
1000.0000 mg | ORAL_TABLET | Freq: Once | ORAL | Status: AC
  Administered 2020-04-24: 1000 mg via ORAL
  Filled 2020-04-24: qty 2

## 2020-04-24 MED ORDER — LACTATED RINGERS IV BOLUS (SEPSIS)
400.0000 mL | Freq: Once | INTRAVENOUS | Status: AC
Start: 2020-04-24 — End: 2020-04-24
  Administered 2020-04-24: 400 mL via INTRAVENOUS

## 2020-04-24 MED ORDER — DOXYCYCLINE HYCLATE 100 MG PO CAPS: 100.0000 mg | ORAL_CAPSULE | Freq: Two times a day (BID) | ORAL | 0 refills | Status: AC

## 2020-04-24 MED ORDER — LACTATED RINGERS IV SOLN: INTRAVENOUS | Status: DC

## 2020-04-24 MED ORDER — METRONIDAZOLE 500 MG PO TABS: 500.0000 mg | ORAL_TABLET | Freq: Two times a day (BID) | ORAL | 0 refills | Status: AC

## 2020-04-24 MED ORDER — LACTATED RINGERS IV BOLUS (SEPSIS)
1000.0000 mL | Freq: Once | INTRAVENOUS | Status: AC
  Administered 2020-04-24: 1000 mL via INTRAVENOUS

## 2020-04-24 NOTE — ED Notes (Signed)
In to round on client, pt very alert, appears comfortable, very talkative with nursing staff. Mentioned to client about questionable response to the SI questions, pt stated she has no plans or even thoughts of SI, stated "I feel like I was going to die when I was pregnant", clarified statement and thoughts with client, and per this RN, pt does not demonstrate or communicates any ideas, statements or thoughts of having SI.

## 2020-04-24 NOTE — ED Notes (Signed)
REPEAT LACTIC ACID ORDER IS CANCELLED PER VO OF ED PA

## 2020-04-24 NOTE — Sepsis Progress Note (Signed)
[facility]ink is following this sepsis 

## 2020-04-24 NOTE — Discharge Instructions (Signed)
As discussed, I am sending you home with 2 antibiotics.  Take as prescribed and finish all antibiotics.  Please follow-up with your OB/GYN on Monday for further evaluation.  Return to the ER if you develop worsening abdominal pain, fever that is not controlled on ibuprofen/tylenol, or worsening symptoms. You may take over the counter ibuprofen or tylenol as needed for abdominal pain.

## 2020-04-24 NOTE — ED Triage Notes (Signed)
Pt reports abdominal pain , fever and diarrhea x 2 days , recent procedure 04/20/20. Vaginal bleeding.

## 2020-04-24 NOTE — ED Provider Notes (Signed)
[facility] EMERGENCY DEPARTMENT Provider Note   CSN: [id number] Arrival date & time: 04/24/20  1109     History Chief Complaint  Patient presents with  . Abdominal Pain  . Fever  . Suicidal    Kathryn Daniels is a 30 y.o. female with a past medical history significant for asthma and PMDD who presents to the ED due to lower abdominal pain, fever, and diarrhea that started last night.  Patient admits to greater than 10 episodes of nonbloody diarrhea.  Patient had a recent D&E on 4/19 at [facility]. She notes she was roughly [redacted] weeks pregnant with her first pregnancy. She admits to a 102F fever prior to arrival. Abdominal pain located in lower quadrants and left side. No previous abdominal operations. No nausea, vomiting, chest pain, or shortness of breath. Admits to vaginal bleeding; however denies passing clots. She was last sexually active 1 week ago. No urinary symptoms. No treatment prior to arrival. No aggravating or alleviating factors.   Patient also endorses intermittent "thoughts of dying" during pregnancy. Patient denies suicidal ideations. She notes she had thoughts of being killed during labor/delivery, not thoughts of killing herself. She denies current SI, HI, and auditory/visual hallucinations.  She is not currently on any medications or under the care of a behavioral health specialist.  History obtained from patient, patient's mother, and past medical records. No interpreter used during encounter.       Past Medical History:  Diagnosis Date  . Asthma   . PMDD (premenstrual dysphoric disorder)     There are no problems to display for this patient.   History reviewed. No pertinent surgical history.   OB History   No obstetric history on file.     No family history on file.  Social History   Tobacco Use  . Smoking status: Never Smoker  . Smokeless tobacco: Never Used  Vaping Use  . Vaping Use: Never used  Substance Use Topics  .  Alcohol use: Yes    Comment: weekly  . Drug use: Yes    Types: Marijuana    Home Medications Prior to Admission medications   Medication Sig Start Date End Date Taking? Authorizing Provider  doxycycline (VIBRAMYCIN) 100 MG capsule Take 1 capsule (100 mg total) by mouth 2 (two) times daily for 5 days. 04/24/20 04/29/20 Yes Laveyah Oriol, Merla Riches, PA-C  metroNIDAZOLE (FLAGYL) 500 MG tablet Take 1 tablet (500 mg total) by mouth 2 (two) times daily for 5 days. 04/24/20 04/29/20 Yes Lawren Sexson, Merla Riches, PA-C  pantoprazole (PROTONIX) 40 MG tablet Take 1 tablet (40 mg total) by mouth daily. 05/09/17   Dione Booze, MD  traMADol (ULTRAM) 50 MG tablet Take 1 tablet (50 mg total) by mouth every 6 (six) hours as needed. 05/09/17   Dione Booze, MD    Allergies    Patient has no known allergies.  Review of Systems   Review of Systems  Constitutional: Positive for chills and fever.  Respiratory: Negative for shortness of breath.   Cardiovascular: Negative for chest pain.  Gastrointestinal: Positive for abdominal pain and diarrhea. Negative for nausea and vomiting.  Genitourinary: Positive for vaginal bleeding.  All other systems reviewed and are negative.   Physical Exam Updated Vital Signs BP 120/63   Pulse 75   Temp 99.2 F (37.3 C) (Oral)   Resp 13   Ht 6\' 1"  (1.854 m)   Wt (!) 170.1 kg   LMP 12/13/2019   SpO2 100%   BMI  49.48 kg/m   Physical Exam Vitals and nursing note reviewed. Exam conducted with a chaperone present.  Constitutional:      General: She is not in acute distress.    Appearance: She is not ill-appearing.  HENT:     Head: Normocephalic.  Eyes:     Pupils: Pupils are equal, round, and reactive to light.  Cardiovascular:     Rate and Rhythm: Normal rate and regular rhythm.     Pulses: Normal pulses.     Heart sounds: Normal heart sounds. No murmur heard. No friction rub. No gallop.   Pulmonary:     Effort: Pulmonary effort is normal.     Breath sounds: Normal breath  sounds.  Abdominal:     General: Abdomen is flat. There is no distension.     Palpations: Abdomen is soft.     Tenderness: There is no abdominal tenderness. There is no guarding or rebound.     Comments: Abdomen soft, nondistended, nontender to palpation in all quadrants without guarding or peritoneal signs. No rebound.   Genitourinary:    Comments: Pelvic exam performed with chaperone in room.  Mild amount of blood in vaginal vault.  Tenderness throughout speculum exam.  No CMT.  No adnexal masses or tenderness. Musculoskeletal:        General: Normal range of motion.     Cervical back: Neck supple.  Skin:    General: Skin is warm and dry.  Neurological:     General: No focal deficit present.     Mental Status: She is alert.  Psychiatric:        Mood and Affect: Mood normal.        Behavior: Behavior normal.     ED Results / Procedures / Treatments   Labs (all labs ordered are listed, but only abnormal results are displayed) Labs Reviewed  WET PREP, GENITAL - Abnormal; Notable for the following components:      Result Value   Clue Cells Wet Prep HPF POC PRESENT (*)    WBC, Wet Prep HPF POC MODERATE (*)    All other components within normal limits  CBC WITH DIFFERENTIAL/PLATELET - Abnormal; Notable for the following components:   Lymphs Abs 0.4 (*)    All other components within normal limits  COMPREHENSIVE METABOLIC PANEL - Abnormal; Notable for the following components:   Sodium 133 (*)    CO2 20 (*)    Glucose, Bld 100 (*)    Calcium 8.3 (*)    Albumin 3.2 (*)    All other components within normal limits  HCG, QUANTITATIVE, PREGNANCY - Abnormal; Notable for the following components:   hCG, Beta Chain, Quant, S 1,026 (*)    All other components within normal limits  PREGNANCY, URINE - Abnormal; Notable for the following components:   Preg Test, Ur POSITIVE (*)    All other components within normal limits  URINALYSIS, ROUTINE W REFLEX MICROSCOPIC - Abnormal; Notable  for the following components:   APPearance HAZY (*)    Specific Gravity, Urine <1.005 (*)    Hgb urine dipstick MODERATE (*)    All other components within normal limits  URINALYSIS, MICROSCOPIC (REFLEX) - Abnormal; Notable for the following components:   Bacteria, UA RARE (*)    All other components within normal limits  RESP PANEL BY RT-PCR (FLU A&B, COVID) ARPGX2  CULTURE, BLOOD (ROUTINE X 2)  CULTURE, BLOOD (ROUTINE X 2)  URINE CULTURE  LIPASE, BLOOD  LACTIC ACID, PLASMA  PROTIME-INR  APTT  GC/CHLAMYDIA PROBE AMP ([place]) NOT AT [facility] [place]    EKG EKG Interpretation  Date/Time:  Friday April 24 2020 12:20:12 EDT Ventricular Rate:  103 PR Interval:  153 QRS Duration: 92 QT Interval:  335 QTC Calculation: 439 R Axis:   55 Text Interpretation: Sinus tachycardia Since last tracing rate faster Confirmed by Linwood Dibbles [phone]) on 04/24/2020 12:25:06 PM   Radiology DG Chest Port 1 View  Result Date: 04/24/2020 CLINICAL DATA:  Questionable sepsis, evaluate for abnormality. EXAM: PORTABLE CHEST 1 VIEW COMPARISON:  November 20, 2014. FINDINGS: Cardiac silhouette is accentuated by low lung volumes and AP portable technique. Low lung volumes. No consolidation. No visible pleural effusions or pneumothorax. No acute osseous abnormality. IMPRESSION: Low lung volumes without evidence of acute cardiopulmonary disease. Electronically Signed   By: Feliberto Harts MD   On: 04/24/2020 13:09   US OB LESS THAN 14 WEEKS WITH OB TRANSVAGINAL  Result Date: 04/24/2020 CLINICAL DATA:  Status post D & E 04/20/2020. Now with fever and diarrhea. EXAM: TRANSABDOMINAL AND TRANSVAGINAL ULTRASOUND OF PELVIS TECHNIQUE: Both transabdominal and transvaginal ultrasound examinations of the pelvis were performed. Transabdominal technique was performed for global imaging of the pelvis including uterus, ovaries, adnexal regions, and pelvic cul-de-sac. It was necessary to proceed with endovaginal exam following the  transabdominal exam to visualize the endometrium and ovaries. COMPARISON:  None FINDINGS: Uterus Measurements: 12.1 x 7.5 x 8.5 cm No myometrial abnormalities are identified. Endometrium Thickness: 2.7 cm. No fluid collections, cystic area is or heterogeneous soft tissue elements with blood flow to suggest retained products. Right ovary Unable to visualize.  No adnexal mass. Left ovary Unable to visualize.  No adnexal mass. Other findings No abnormal free fluid. IMPRESSION: 1. Status post D and E. 2. No definite sonographic findings for retained products. 3. Unable to visualize the ovaries.  No adnexal masses. Electronically Signed   By: Rudie Meyer M.D.   On: 04/24/2020 14:12    Procedures Procedures   Medications Ordered in ED Medications  lactated ringers infusion (has no administration in time range)  lactated ringers bolus 1,000 mL (0 mLs Intravenous Stopped 04/24/20 1355)    And  lactated ringers bolus 1,000 mL (0 mLs Intravenous Stopped 04/24/20 1533)    And  lactated ringers bolus 400 mL (0 mLs Intravenous Stopped 04/24/20 1228)  cefTRIAXone (ROCEPHIN) 2 g in sodium chloride 0.9 % 100 mL IVPB (0 g Intravenous Stopped 04/24/20 1249)  metroNIDAZOLE (FLAGYL) IVPB 500 mg (0 mg Intravenous Stopped 04/24/20 1355)  acetaminophen (TYLENOL) tablet 1,000 mg (1,000 mg Oral Given 04/24/20 1215)    ED Course  I have reviewed the triage vital signs and the nursing notes.  Pertinent labs & imaging results that were available during my care of the patient were reviewed by me and considered in my medical decision making (see chart for details).  Clinical Course as of 04/24/20 1601  Fri Apr 24, 2020  1245 Preg Test, Ur(!): POSITIVE [CA]  1245 Lactic Acid, Venous: 1.4 [CA]  1251 WBC: 7.5 [CA]  1256 HCG, Beta Chain, Quant, S(!): 1,026 [CA]  1438 Hgb urine dipstickMarland Kitchen): MODERATE [CA]  1509 Bacteria, UA(!): RARE [CA]  1535 Clue Cells Wet Prep HPF POC(!): PRESENT [CA]    Clinical Course User  Index [CA] Jesusita Oka   MDM Rules/Calculators/A&P                         30 year old female presents to the  ED due to abdominal pain, diarrhea, and fever x1 day.  Patient had a D&E on 4/19 when she was roughly [redacted] weeks pregnant. Upon arrival, patient found to be febrile and tachycardic. Code sepsis initiated. Abdomen soft, non-distended, and non-tender. Low suspicion for acute abdomen. IVFs and antibiotics started. Sepsis labs. [place] ordered to rule out retained products.   Pelvic exam performed with chaperone in room.  Mild amount of blood in vaginal vault.  No vaginal discharge.  Tenderness during speculum exam. No CMT. Doubt PID. CBC reassuring with no leukocytosis and normal hemoglobin.  UA significant for moderate hematuria.  No signs of infection.  Doubt acute cystitis.  Wet prep positive for clue cells and moderate white blood cells.  Gonorrhea/chlamydia pending.  COVID/influenza negative.  Lactic acid normal.  Lipase normal.  CMP significant for hyponatremia 133, hyperglycemia 100 with no anion gap.  Doubt DKA. [place] personally reviewed which demonstrates: IMPRESSION:  1. Status post D and E.  2. No definite sonographic findings for retained products.  3. Unable to visualize the ovaries. No adnexal masses.   CXR personally reviewed which demonstrates low lung volume with no signs of infection.  EKG personally reviewed which demonstrates sinus tachycardia with no signs of acute ischemia.  3:35 PM Discussed with on-call provider (Dr. Conard Novak) at patient's OBGYN with [facility] who declined any recommendations and advised to call [facility].  3:51 PM Discussed case with Dr. Alysia Penna with [facility] who recommends treating with doxycyline + Flagyl x5 days and to follow-up with OBGYN on Monday.   During patient's stay, her vitals have normalized. Low suspicion for sepsis at this time given reassuring work-up. Patient informed she will need to return if her blood  cultures are positive. Patient discharged with doxycycline and flagyl.  Advised patient to follow-up with OB/GYN on Monday or return to the ED if symptoms worsen. Strict ED precautions discussed with patient. Patient states understanding and agrees to plan. Patient discharged home in no acute distress and stable vitals.  Discussed case with Dr. Lynelle Doctor who agrees with assessment and plan.  Final Clinical Impression(s) / ED Diagnoses Final diagnoses:  Lower abdominal pain  Diarrhea, unspecified type    Rx / DC Orders ED Discharge Orders         Ordered    doxycycline (VIBRAMYCIN) 100 MG capsule  2 times daily        04/24/20 1600    metroNIDAZOLE (FLAGYL) 500 MG tablet  2 times daily        04/24/20 1600           Jesusita Oka 04/24/20 1607    Linwood Dibbles, MD 04/25/20 [phone]

## 2020-04-25 LAB — URINE CULTURE

## 2020-04-27 ENCOUNTER — Emergency Department (HOSPITAL_BASED_OUTPATIENT_CLINIC_OR_DEPARTMENT_OTHER)
Admission: EM | Admit: 2020-04-27 | Discharge: 2020-04-27 | Disposition: A | Discharge status: Home / Self Care | Payer: BC Managed Care – PPO | Attending: Emergency Medicine | Admitting: Emergency Medicine

## 2020-04-27 ENCOUNTER — Other Ambulatory Visit: Payer: Self-pay

## 2020-04-27 ENCOUNTER — Encounter (HOSPITAL_BASED_OUTPATIENT_CLINIC_OR_DEPARTMENT_OTHER): Payer: Self-pay

## 2020-04-27 DIAGNOSIS — J45909 Unspecified asthma, uncomplicated: Secondary | ICD-10-CM | POA: Insufficient documentation

## 2020-04-27 DIAGNOSIS — N939 Abnormal uterine and vaginal bleeding, unspecified: Secondary | ICD-10-CM | POA: Diagnosis present

## 2020-04-27 LAB — CBC WITH DIFFERENTIAL/PLATELET
Abs Immature Granulocytes: 0.01 10*3/uL (ref 0.00–0.07)
Basophils Absolute: 0 10*3/uL (ref 0.0–0.1)
Basophils Relative: 0 %
Eosinophils Absolute: 0.1 10*3/uL (ref 0.0–0.5)
Eosinophils Relative: 1 %
HCT: 33.4 % — ABNORMAL LOW (ref 36.0–46.0)
Hemoglobin: 11 g/dL — ABNORMAL LOW (ref 12.0–15.0)
Immature Granulocytes: 0 %
Lymphocytes Relative: 35 %
Lymphs Abs: 1.9 10*3/uL (ref 0.7–4.0)
MCH: 31.2 pg (ref 26.0–34.0)
MCHC: 32.9 g/dL (ref 30.0–36.0)
MCV: 94.6 fL (ref 80.0–100.0)
Monocytes Absolute: 0.4 10*3/uL (ref 0.1–1.0)
Monocytes Relative: 7 %
Neutro Abs: 3.1 10*3/uL (ref 1.7–7.7)
Neutrophils Relative %: 57 %
Platelets: 176 10*3/uL (ref 150–400)
RBC: 3.53 MIL/uL — ABNORMAL LOW (ref 3.87–5.11)
RDW: 13.6 % (ref 11.5–15.5)
WBC: 5.5 10*3/uL (ref 4.0–10.5)
nRBC: 0 % (ref 0.0–0.2)

## 2020-04-27 LAB — GC/CHLAMYDIA PROBE AMP (~~LOC~~) NOT AT ARMC
Chlamydia: NEGATIVE
Comment: NEGATIVE
Comment: NORMAL
Neisseria Gonorrhea: NEGATIVE

## 2020-04-27 MED ORDER — TRANEXAMIC ACID-NACL 1000-0.7 MG/100ML-% IV SOLN
1000.0000 mg | Freq: Once | INTRAVENOUS | Status: AC
  Administered 2020-04-27: 1000 mg via INTRAVENOUS
  Filled 2020-04-27: qty 100

## 2020-04-27 MED ORDER — TRANEXAMIC ACID 650 MG PO TABS: 1300.0000 mg | ORAL_TABLET | Freq: Three times a day (TID) | ORAL | 0 refills | Status: AC

## 2020-04-27 NOTE — ED Provider Notes (Signed)
[facility] EMERGENCY DEPARTMENT Provider Note   CSN: [id number] Arrival date & time: 04/27/20  1107     History Chief Complaint  Patient presents with  . Vaginal Bleeding    Kathryn Daniels is a 30 y.o. female with past medical history significant for D&E on 04/21/2020 at [facility] in [place] who presents the ED with complaints of vaginal bleeding.  I reviewed patient's medical record and she was seen in the ED on 04/24/2020 for lower abdominal pain and fever.  Sepsis work-up was initiated.  There was no sonographic findings for retained products of conception.  No cervical motion tenderness on pelvic exam and there was lower suspicion for PID.  GC testing was pending.  Cases were discussed with [facility] who recommended that we discussed with [facility] health OB[facility].  Dr. Alysia Penna recommended doxycycline plus metronidazole x5 days and outpatient follow-up with OB/GYN on Monday, 04/27/2020.  On my examination, patient reports that she was starting to feel improved.  She denies any recent fevers or abdominal pain.  She states that yesterday she started passing large clots and it persisted this morning.  She states that she is changing her pad every hour.  She called the OB/GYN who performed her procedure and they advised her to come to the ED for evaluation.  She feels frustrated that she is being passed back and forth.  However, they state that given her amount of bleeding, there is something wrong and that she needs to be seen by [place].  She has been taking her antibiotics, as prescribed.  She states that she had started to feel better until the initiation of heavy bleeding beginning last night.  She has not had any sexual intercourse since her procedure.  HPI     Past Medical History:  Diagnosis Date  . Asthma   . PMDD (premenstrual dysphoric disorder)     There are no problems to display for this patient.   Past Surgical History:  Procedure Laterality Date  .  DILATION AND CURETTAGE OF UTERUS       OB History   No obstetric history on file.     No family history on file.  Social History   Tobacco Use  . Smoking status: Never Smoker  . Smokeless tobacco: Never Used  Vaping Use  . Vaping Use: Never used  Substance Use Topics  . Alcohol use: Yes    Comment: occ  . Drug use: Yes    Types: Marijuana    Home Medications Prior to Admission medications   Medication Sig Start Date End Date Taking? Authorizing Provider  tranexamic acid (LYSTEDA) 650 MG TABS tablet Take 2 tablets (1,300 mg total) by mouth 3 (three) times daily for 5 days. 04/27/20 05/02/20 Yes Lorelee New, PA-C  doxycycline (VIBRAMYCIN) 100 MG capsule Take 1 capsule (100 mg total) by mouth 2 (two) times daily for 5 days. 04/24/20 04/29/20  Mannie Stabile, PA-C  metroNIDAZOLE (FLAGYL) 500 MG tablet Take 1 tablet (500 mg total) by mouth 2 (two) times daily for 5 days. 04/24/20 04/29/20  Mannie Stabile, PA-C  pantoprazole (PROTONIX) 40 MG tablet Take 1 tablet (40 mg total) by mouth daily. 05/09/17   Dione Booze, MD  traMADol (ULTRAM) 50 MG tablet Take 1 tablet (50 mg total) by mouth every 6 (six) hours as needed. 05/09/17   Dione Booze, MD    Allergies    Patient has no known allergies.  Review of Systems   Review  of Systems  All other systems reviewed and are negative.   Physical Exam Updated Vital Signs BP 124/66   Pulse (!) 58   Temp 98.8 F (37.1 C)   Resp 18   SpO2 100%   Physical Exam Vitals and nursing note reviewed. Exam conducted with a chaperone present.  Constitutional:      General: She is not in acute distress.    Appearance: Normal appearance. She is not ill-appearing or toxic-appearing.  HENT:     Head: Normocephalic and atraumatic.  Eyes:     General: No scleral icterus.    Conjunctiva/sclera: Conjunctivae normal.  Cardiovascular:     Rate and Rhythm: Normal rate.     Pulses: Normal pulses.  Pulmonary:     Effort: Pulmonary effort  is normal. No respiratory distress.  Abdominal:     General: Abdomen is flat. There is no distension.     Palpations: Abdomen is soft.     Tenderness: There is no abdominal tenderness. There is no guarding.  Musculoskeletal:     Cervical back: Normal range of motion.  Skin:    General: Skin is dry.  Neurological:     Mental Status: She is alert.     GCS: GCS eye subscore is 4. GCS verbal subscore is 5. GCS motor subscore is 6.  Psychiatric:        Mood and Affect: Mood normal.        Behavior: Behavior normal.        Thought Content: Thought content normal.     ED Results / Procedures / Treatments   Labs (all labs ordered are listed, but only abnormal results are displayed) Labs Reviewed  CBC WITH DIFFERENTIAL/PLATELET - Abnormal; Notable for the following components:      Result Value   RBC 3.53 (*)    Hemoglobin 11.0 (*)    HCT 33.4 (*)    All other components within normal limits    EKG None  Radiology No results found.  Procedures Procedures   Medications Ordered in ED Medications  tranexamic acid (CYKLOKAPRON) IVPB 1,000 mg (1,000 mg Intravenous New Bag/Given 04/27/20 1413)    ED Course  I have reviewed the triage vital signs and the nursing notes.  Pertinent labs & imaging results that were available during my care of the patient were reviewed by me and considered in my medical decision making (see chart for details).  Clinical Course as of 04/27/20 1448  Mon Apr 27, 2020  1335 I spoke with Dr. Vergie Living, OB/GYN, who recommended 1,000 mg IV TXA over 10 minutes here in the ED and checking hemoglobin.  If reasonable, can be discharged home with continued TXA 1300 mg 3 times daily x5 days.  Recommended close outpatient follow-up with her OB/GYN at [facility]. [GG]    Clinical Course User Index [GG] Lorelee New, PA-C   MDM Rules/Calculators/A&P                          Kathryn Daniels was evaluated in Emergency Department on 04/27/2020 for the symptoms  described in the history of present illness. She was evaluated in the context of the global COVID-19 pandemic, which necessitated consideration that the patient might be at risk for infection with the SARS-CoV-2 virus that causes COVID-19. Institutional protocols and algorithms that pertain to the evaluation of patients at risk for COVID-19 are in a state of rapid change based on information released by regulatory bodies including  the [employer] and federal and state organizations. These policies and algorithms were followed during the patient's care in the ED.  I personally reviewed patient's medical chart and all notes from triage and staff during today's encounter. I have also ordered and reviewed all labs and imaging that I felt to be medically necessary in the evaluation of this patient's complaints and with consideration of their physical exam. If needed, translation services were available and utilized.   I spoke with Dr. Vergie Living, OB/GYN, who recommended 1,000 mg IV TXA over 10 minutes here in the ED and checking hemoglobin.  If reasonable, can be discharged home with continued TXA 1300 mg 3 times daily x5 days.  Recommended close outpatient follow-up with her OB/GYN at [facility].  Hemoglobin reduced from 12.4-11.0 when compared to labs obtained 4 days ago.  Given lack of any significant decrease combined with the fact that she is hemodynamically stable, I feel as though outpatient follow-up with OB/GYN with continued TXA orally is reasonable.  Final Clinical Impression(s) / ED Diagnoses Final diagnoses:  Vaginal bleeding    Rx / DC Orders ED Discharge Orders         Ordered    tranexamic acid (LYSTEDA) 650 MG TABS tablet  3 times daily        04/27/20 1447           Elvera Maria 04/27/20 1448    Terrilee Files, MD 04/27/20 [phone]

## 2020-04-27 NOTE — ED Notes (Signed)
Patient reports soaking through a menstrual pad in less than one hour. Called the clinic that she recently received a D & E and they advised to be seen at ED. No n/v/d. No lightheadedness.

## 2020-04-27 NOTE — Discharge Instructions (Signed)
Your hemoglobin has only dropped mildly in setting of your increased vaginal bleeding and you are hemodynamically stable.  You can follow-up with your OB/GYN who performed the D&E regarding your postop bleeding.  Please also follow-up with your [facility] OB[facility]N who you saw last year.  You have been treated with tranexamic acid (TXA) IV here in the ED and I would like you to continue with oral TXA on outpatient basis.  Return to the ER or seek immediate medical attention should you experience any new or worsening symptoms.

## 2020-04-27 NOTE — ED Triage Notes (Signed)
Pt states she had D&C on 4/18 and 4/19-states she was seen here on 4/22-states yesterday c/o abd pain and vaginal bleeding-NAD-steady gait

## 2020-04-29 LAB — CULTURE, BLOOD (ROUTINE X 2)
Culture: NO GROWTH
Culture: NO GROWTH
Special Requests: ADEQUATE
Special Requests: ADEQUATE

## 2022-02-13 IMAGING — DX DG CHEST 1V PORT
1 series · 1 of 1 positions shown · non-contrast
Comparison: November 20, 2014.

CLINICAL DATA: Questionable sepsis, evaluate for abnormality.

EXAM:
PORTABLE CHEST 1 VIEW

[chest ap]
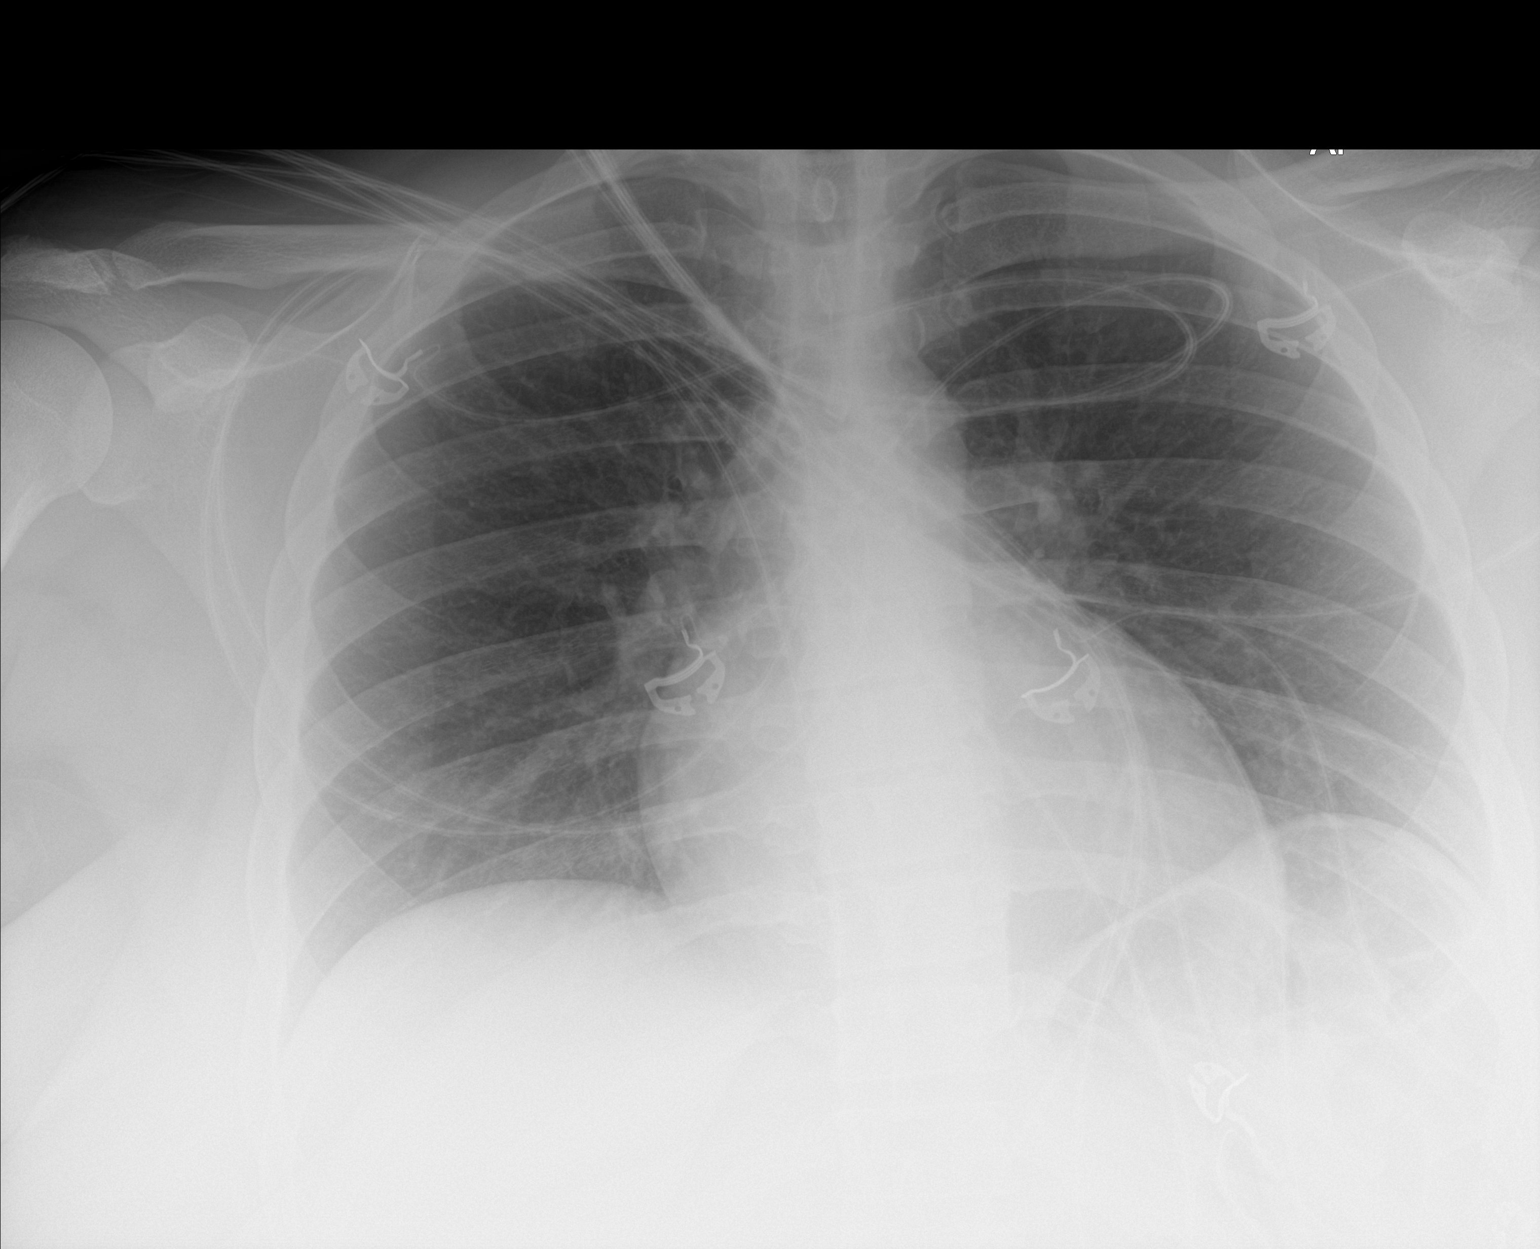

[1 of 1 positions shown; findings below may reference images not displayed]

FINDINGS: Cardiac silhouette is accentuated by low lung volumes and AP
portable technique. Low lung volumes. No consolidation. No visible
pleural effusions or pneumothorax. No acute osseous abnormality.
IMPRESSION: Low lung volumes without evidence of acute cardiopulmonary disease.

## 2022-09-10 ENCOUNTER — Encounter (HOSPITAL_BASED_OUTPATIENT_CLINIC_OR_DEPARTMENT_OTHER): Payer: Self-pay

## 2022-09-10 ENCOUNTER — Emergency Department (HOSPITAL_BASED_OUTPATIENT_CLINIC_OR_DEPARTMENT_OTHER)
Admission: EM | Admit: 2022-09-10 | Discharge: 2022-09-10 | Disposition: A | Discharge status: Home / Self Care | Payer: BC Managed Care – PPO | Attending: Emergency Medicine | Admitting: Emergency Medicine

## 2022-09-10 ENCOUNTER — Other Ambulatory Visit: Payer: Self-pay

## 2022-09-10 DIAGNOSIS — Z202 Contact with and (suspected) exposure to infections with a predominantly sexual mode of transmission: Secondary | ICD-10-CM | POA: Diagnosis present

## 2022-09-10 DIAGNOSIS — R369 Urethral discharge, unspecified: Secondary | ICD-10-CM | POA: Insufficient documentation

## 2022-09-10 LAB — WET PREP, GENITAL
Clue Cells Wet Prep HPF POC: NONE SEEN
Sperm: NONE SEEN
Trich, Wet Prep: NONE SEEN
WBC, Wet Prep HPF POC: <10 (ref <10)
Yeast Wet Prep HPF POC: NONE SEEN

## 2022-09-10 LAB — PREGNANCY, URINE: Preg Test, Ur: NEGATIVE

## 2022-09-10 MED ORDER — CEFTRIAXONE SODIUM 500 MG IJ SOLR
500.0000 mg | Freq: Once | INTRAMUSCULAR | Status: AC
  Administered 2022-09-10: 500 mg via INTRAMUSCULAR
  Filled 2022-09-10: qty 500

## 2022-09-10 MED ORDER — DOXYCYCLINE HYCLATE 100 MG PO TABS: 100.0000 mg | ORAL_TABLET | Freq: Two times a day (BID) | ORAL | 0 refills | Status: AC

## 2022-09-10 MED ORDER — LIDOCAINE HCL (PF) 1 % IJ SOLN
1.0000 mL | Freq: Once | INTRAMUSCULAR | Status: AC
  Administered 2022-09-10: 1 mL
  Filled 2022-09-10: qty 5

## 2022-09-10 NOTE — ED Notes (Signed)
Pt reports having unprotected sex, no symptoms but partner has penile discharge

## 2022-09-10 NOTE — Discharge Instructions (Signed)
Please follow-up with your PCP as needed.  I have prophylactically treated you for possible STDs.  You will get your results back in 3 to 5 days.  We will call you if it is positive.  Do not have sex with your partner until you both have finished your course of antibiotics, or until you both have negative STD testing.  Stacks is got multiple kind of things

## 2022-09-10 NOTE — ED Triage Notes (Signed)
Pt having unprotected sex with partner. Partner c/o itching and penile discharge. Pt currently with no symptoms but wants to be checked

## 2022-09-10 NOTE — ED Provider Notes (Signed)
[facility] [facility] Provider Note   CSN: [id number] Arrival date & time: 09/10/22  6295     History  Chief Complaint  Patient presents with   std check    Kathryn Daniels is a 32 y.o. female, no pertinent past medical history, who presents to the ED secondary to concern for STD.  She states she is having unprotected sex, with only 1 partner, and that he is having penile itching, and penile discharge.  She states she currently has no symptoms, no vaginal discharge, urinary complaints, or abdominal pain or flank pain.  Would like to be treated for STDs.    Home Medications Prior to Admission medications   Medication Sig Start Date End Date Taking? Authorizing Provider  doxycycline (VIBRA-TABS) 100 MG tablet Take 1 tablet (100 mg total) by mouth 2 (two) times daily. 09/10/22  Yes Encarnacion Bole L, PA  FLUoxetine (PROZAC) 20 MG capsule Take 20 mg by mouth daily.    [provider]  pantoprazole (PROTONIX) 40 MG tablet Take 1 tablet (40 mg total) by mouth daily. 05/09/17   Dione Booze, MD  traMADol (ULTRAM) 50 MG tablet Take 1 tablet (50 mg total) by mouth every 6 (six) hours as needed. 05/09/17   Dione Booze, MD      Allergies    Penicillins    Review of Systems   Review of Systems  Genitourinary:  Negative for pelvic pain and vaginal bleeding.    Physical Exam Updated Vital Signs BP (!) 141/79 (BP Location: Right Arm)   Pulse 63   Temp 98.3 F (36.8 C) (Oral)   Resp 17   Ht 6\' 1"  (1.854 m)   Wt (!) 162.5 kg   LMP 08/25/2022   SpO2 100%   BMI 47.27 kg/m  Physical Exam Vitals and nursing note reviewed.  Constitutional:      General: She is not in acute distress.    Appearance: She is well-developed.  HENT:     Head: Normocephalic and atraumatic.  Eyes:     Conjunctiva/sclera: Conjunctivae normal.  Cardiovascular:     Rate and Rhythm: Normal rate and regular rhythm.     Heart sounds: No murmur heard. Pulmonary:     Effort:  Pulmonary effort is normal. No respiratory distress.     Breath sounds: Normal breath sounds.  Abdominal:     Palpations: Abdomen is soft.     Tenderness: There is no abdominal tenderness.  Musculoskeletal:        General: No swelling.     Cervical back: Neck supple.  Skin:    General: Skin is warm and dry.     Capillary Refill: Capillary refill takes less than 2 seconds.  Neurological:     Mental Status: She is alert.  Psychiatric:        Mood and Affect: Mood normal.     ED Results / Procedures / Treatments   Labs (all labs ordered are listed, but only abnormal results are displayed) Labs Reviewed  WET PREP, GENITAL  PREGNANCY, URINE  GC/CHLAMYDIA PROBE AMP ([place]) NOT AT [facility]    EKG None  Radiology No results found.  Procedures Procedures    Medications Ordered in ED Medications  cefTRIAXone (ROCEPHIN) injection 500 mg (has no administration in time range)  lidocaine (PF) (XYLOCAINE) 1 % injection 1-2.1 mL (has no administration in time range)    ED Course/ Medical Decision Making/ A&P  Medical Decision Making Patient is a 32 year old female, who is requesting to be treated for possible STD.  Her partner has some penile discharge, and she is concerned he may have given her an STD.  She has no symptoms, no vaginal discharge, urinary symptoms, or pain.  We will prophylactically treat her, as her partner is symptomatic.  We discussed her penicillin allergy, she states she was allergic to just penicillin, but has tolerated amoxicillin in the past.  I discussed this with pharmacy, there is a low risk, for any kind of reaction with penicillin with ceftriaxone, and the patient understood this, she would like to go ahead and pretreat even though she is not having any symptoms, she has she understands risk associated with this.  We discussed return precautions and she was discharged home.  I advised her not to have sex until her partner  is completely treated as well as she is completed with her treatment, or they both have negative STD checks.  She voiced understanding and was discharged home  Amount and/or Complexity of Data Reviewed Labs: ordered.    Details: Negative pregnancy test, negative wet prep  Risk Prescription drug management.    Final Clinical Impression(s) / ED Diagnoses Final diagnoses:  Possible exposure to STD    Rx / DC Orders ED Discharge Orders          Ordered    doxycycline (VIBRA-TABS) 100 MG tablet  2 times daily        09/10/22 1132              Nayson Traweek, [place], [place] 09/10/22 1144    Jacalyn Lefevre, MD 09/10/22 1145

## 2022-09-12 LAB — GC/CHLAMYDIA PROBE AMP (~~LOC~~) NOT AT ARMC
Comment: NEGATIVE
Comment: NORMAL

## 2023-01-06 ENCOUNTER — Encounter (HOSPITAL_BASED_OUTPATIENT_CLINIC_OR_DEPARTMENT_OTHER): Payer: Self-pay | Admitting: Emergency Medicine

## 2023-01-06 ENCOUNTER — Emergency Department (HOSPITAL_BASED_OUTPATIENT_CLINIC_OR_DEPARTMENT_OTHER): Payer: BC Managed Care – PPO

## 2023-01-06 ENCOUNTER — Other Ambulatory Visit: Payer: Self-pay

## 2023-01-06 ENCOUNTER — Emergency Department (HOSPITAL_BASED_OUTPATIENT_CLINIC_OR_DEPARTMENT_OTHER)
Admission: EM | Admit: 2023-01-06 | Discharge: 2023-01-06 | Disposition: A | Discharge status: Home / Self Care | Payer: BC Managed Care – PPO | Attending: Emergency Medicine | Admitting: Emergency Medicine

## 2023-01-06 DIAGNOSIS — N83202 Unspecified ovarian cyst, left side: Secondary | ICD-10-CM | POA: Diagnosis not present

## 2023-01-06 DIAGNOSIS — Z202 Contact with and (suspected) exposure to infections with a predominantly sexual mode of transmission: Secondary | ICD-10-CM | POA: Diagnosis not present

## 2023-01-06 DIAGNOSIS — N3000 Acute cystitis without hematuria: Secondary | ICD-10-CM | POA: Diagnosis not present

## 2023-01-06 DIAGNOSIS — N83201 Unspecified ovarian cyst, right side: Secondary | ICD-10-CM | POA: Diagnosis not present

## 2023-01-06 DIAGNOSIS — R1030 Lower abdominal pain, unspecified: Secondary | ICD-10-CM | POA: Diagnosis present

## 2023-01-06 DIAGNOSIS — Z113 Encounter for screening for infections with a predominantly sexual mode of transmission: Secondary | ICD-10-CM

## 2023-01-06 LAB — URINALYSIS, MICROSCOPIC (REFLEX)

## 2023-01-06 LAB — COMPREHENSIVE METABOLIC PANEL
ALT: 15 U/L (ref 0–44)
AST: 20 U/L (ref 15–41)
Albumin: 3.8 g/dL (ref 3.5–5.0)
Alkaline Phosphatase: 52 U/L (ref 38–126)
Anion gap: 4 — ABNORMAL LOW (ref 5–15)
BUN: 11 mg/dL (ref 6–20)
CO2: 28 mmol/L (ref 22–32)
Calcium: 9.2 mg/dL (ref 8.9–10.3)
Chloride: 106 mmol/L (ref 98–111)
Creatinine, Ser: 0.86 mg/dL (ref 0.44–1.00)
GFR, Estimated: >60 mL/min (ref >60)
Glucose, Bld: 99 mg/dL (ref 70–99)
Potassium: 4.2 mmol/L (ref 3.5–5.1)
Sodium: 138 mmol/L (ref 135–145)
Total Bilirubin: 0.7 mg/dL (ref 0.0–1.2)
Total Protein: 7 g/dL (ref 6.5–8.1)

## 2023-01-06 LAB — CBC
HCT: 38.4 % (ref 36.0–46.0)
Hemoglobin: 12.6 g/dL (ref 12.0–15.0)
MCH: 31.3 pg (ref 26.0–34.0)
MCHC: 32.8 g/dL (ref 30.0–36.0)
MCV: 95.5 fL (ref 80.0–100.0)
Platelets: 164 10*3/uL (ref 150–400)
RBC: 4.02 MIL/uL (ref 3.87–5.11)
RDW: 14.3 % (ref 11.5–15.5)
WBC: 4.2 10*3/uL (ref 4.0–10.5)
nRBC: 0 % (ref 0.0–0.2)

## 2023-01-06 LAB — URINALYSIS, ROUTINE W REFLEX MICROSCOPIC
Bilirubin Urine: NEGATIVE
Glucose, UA: NEGATIVE mg/dL
Ketones, ur: NEGATIVE mg/dL
Leukocytes,Ua: NEGATIVE
Nitrite: NEGATIVE
Protein, ur: NEGATIVE mg/dL
Specific Gravity, Urine: >=1.03 (ref 1.005–1.030)
pH: 5.5 (ref 5.0–8.0)

## 2023-01-06 LAB — CBG MONITORING, ED: Glucose-Capillary: 96 mg/dL (ref 70–99)

## 2023-01-06 LAB — PREGNANCY, URINE: Preg Test, Ur: NEGATIVE

## 2023-01-06 LAB — HIV ANTIBODY (ROUTINE TESTING W REFLEX): HIV Screen 4th Generation wRfx: NONREACTIVE

## 2023-01-06 MED ORDER — ONDANSETRON HCL 4 MG/2ML IJ SOLN
4.0000 mg | Freq: Once | INTRAMUSCULAR | Status: DC
  Filled 2023-01-06: qty 2

## 2023-01-06 MED ORDER — NITROFURANTOIN MONOHYD MACRO 100 MG PO CAPS: 100.0000 mg | ORAL_CAPSULE | Freq: Two times a day (BID) | ORAL | 0 refills | Status: AC

## 2023-01-06 NOTE — Discharge Instructions (Addendum)
 You were seen in the ER today for concerns of groin pain.  Your labs are largely unremarkable but there is some concern for UTI. I have sent a prescription for Macrobid  to your pharmacy for you to take for the next week. Please take this as prescribed.  Your STI testing is currently pending and should be available in the next several days.  If any of these are positive, would recommend having follow-up as you likely need treatment for this.  Your ultrasound was also negative at this time but did appear to show some cystic structures on both ovaries. These are likely benign, but repeat ultrasound is typically recommend in 6-12 weeks.

## 2023-01-06 NOTE — ED Triage Notes (Signed)
 Left groin sharp pain . Progressively getting worse , urinary frequency .  Hx ovarian cyst ,

## 2023-01-06 NOTE — ED Provider Notes (Signed)
  [facility] Provider Note   CSN: [id number] Arrival date & time: 01/06/23  1016     History Chief Complaint  Patient presents with   Groin Pain    left    Kathryn Daniels is a 33 y.o. female.  Patient presents to the emergency department concerns of groin pain.  Reports that she began experience left-sided groin pain about 1 week ago.  She does report that she is sexually active but denies any chance of pregnancy.  Reports sexual activity with 2 partners.  Having some increased urinary frequency but denies any other vaginal concerns.  No vaginal discharge or vaginal itching.  Denies any significant abdominal pain.  No recent fever chills or bodyaches.   Groin Pain       Home Medications Prior to Admission medications   Medication Sig Start Date End Date Taking? Authorizing Provider  nitrofurantoin , macrocrystal-monohydrate, (MACROBID ) 100 MG capsule Take 1 capsule (100 mg total) by mouth 2 (two) times daily for 5 days. 01/06/23 01/11/23 Yes Deyja Sochacki A, PA-C  doxycycline  (VIBRA -TABS) 100 MG tablet Take 1 tablet (100 mg total) by mouth 2 (two) times daily. 09/10/22   Small, Brooke L, PA  FLUoxetine (PROZAC) 20 MG capsule Take 20 mg by mouth daily.    [provider]  pantoprazole  (PROTONIX ) 40 MG tablet Take 1 tablet (40 mg total) by mouth daily. 05/09/17   Raford Lenis, MD  traMADol  (ULTRAM ) 50 MG tablet Take 1 tablet (50 mg total) by mouth every 6 (six) hours as needed. 05/09/17   Raford Lenis, MD      Allergies    Penicillins    Review of Systems   Review of Systems  Genitourinary:  Positive for frequency and pelvic pain.  All other systems reviewed and are negative.   Physical Exam Updated Vital Signs BP (!) 123/59   Pulse (!) 49   Temp 98.5 F (36.9 C) (Oral)   Resp 15   Ht 6' 1 (1.854 m)   Wt (!) 163.3 kg   LMP 12/19/2022   SpO2 98%   BMI 47.50 kg/m  Physical Exam Vitals and nursing note reviewed.   Constitutional:      General: She is not in acute distress.    Appearance: She is well-developed.  HENT:     Head: Normocephalic and atraumatic.  Eyes:     Conjunctiva/sclera: Conjunctivae normal.  Cardiovascular:     Rate and Rhythm: Normal rate and regular rhythm.     Heart sounds: No murmur heard. Pulmonary:     Effort: Pulmonary effort is normal. No respiratory distress.     Breath sounds: Normal breath sounds.  Abdominal:     Palpations: Abdomen is soft.     Tenderness: There is abdominal tenderness in the left lower quadrant.       Comments: Difficult to assess for any regional lymphadenopathy in the inguinal canal due to body habitus  Musculoskeletal:        General: No swelling.     Cervical back: Neck supple.  Skin:    General: Skin is warm and dry.     Capillary Refill: Capillary refill takes less than 2 seconds.  Neurological:     Mental Status: She is alert.  Psychiatric:        Mood and Affect: Mood normal.     ED Results / Procedures / Treatments   Labs (all labs ordered are listed, but only abnormal results are displayed) Labs Reviewed  URINALYSIS, ROUTINE W REFLEX MICROSCOPIC - Abnormal; Notable for the following components:      Result Value   Hgb urine dipstick TRACE (*)    All other components within normal limits  URINALYSIS, MICROSCOPIC (REFLEX) - Abnormal; Notable for the following components:   Bacteria, UA MANY (*)    All other components within normal limits  COMPREHENSIVE METABOLIC PANEL - Abnormal; Notable for the following components:   Anion gap 4 (*)    All other components within normal limits  PREGNANCY, URINE  CBC  HIV ANTIBODY (ROUTINE TESTING W REFLEX)  CBG MONITORING, ED  GC/CHLAMYDIA PROBE AMP ([place]) NOT AT [facility]    EKG None  Radiology US  PELVIC COMPLETE W TRANSVAGINAL AND TORSION R/O Result Date: 01/06/2023 CLINICAL DATA:  Left lower quadrant pain EXAM: TRANSABDOMINAL AND TRANSVAGINAL ULTRASOUND OF PELVIS DOPPLER  ULTRASOUND OF OVARIES TECHNIQUE: Both transabdominal and transvaginal ultrasound examinations of the pelvis were performed. Transabdominal technique was performed for global imaging of the pelvis including uterus, ovaries, adnexal regions, and pelvic cul-de-sac. It was necessary to proceed with endovaginal exam following the transabdominal exam to visualize the ovaries and endometrium. Color and duplex Doppler ultrasound was utilized to evaluate blood flow to the ovaries. COMPARISON:  None Available. FINDINGS: Uterus Measurements: 7.8 x 4.9 x 6.0 cm = volume: 121 mL. No fibroids or other mass visualized. Endometrium Thickness: 12 mm.  No focal abnormality visualized. Right ovary Measurements: 5.2 x 2.2 x 2.3 cm = volume: 13 mL. There is a hyperechoic focal area in the right ovary measuring 1.3 x 1.2 x 1.1 cm. Left ovary Measurements: 4.7 x 3.1 x 3.6 cm = volume: 27 mL. Anechoic cyst present measuring 2.1 x 2.0 x 2.0 cm. Pulsed Doppler evaluation of both ovaries demonstrates normal low-resistance arterial and venous waveforms. Other findings No abnormal free fluid. IMPRESSION: 1. No evidence for ovarian torsion. 2. Hyperechoic focal area in the right ovary measuring 1.3 cm. This may represent a hemorrhagic cyst, endometrioma, or dermoid. Follow-up ultrasound in 6-12 weeks is recommended to ensure resolution. 3. Simple cyst in the left ovary measuring 2.1 cm. 4. Normal appearance of the uterus and endometrium. Electronically Signed   By: Greig Pique M.D.   On: 01/06/2023 17:56    Procedures Procedures   Medications Ordered in ED Medications  ondansetron  (ZOFRAN ) injection 4 mg (4 mg Intravenous Patient Refused/Not Given 01/06/23 1631)    ED Course/ Medical Decision Making/ A&P                                 Medical Decision Making Amount and/or Complexity of Data Reviewed Labs: ordered. Radiology: ordered.   This patient presents to the ED for concern of groin pain.  Differential diagnosis  includes UTI, STI, ovarian cyst, ovarian torsion   Lab Tests:  I Ordered, and personally interpreted labs.  The pertinent results include: UA with some possible concerns for UTI as there are many bacteria, 0-5 white blood cells, and trace hemoglobin, urine pregnancy negative, CBG 96, CBC unremarkable, CMP without evidence of dehydration, GC/chlamydia pending, HIV pending   Imaging Studies ordered:  I ordered imaging studies including pelvic ultrasound I independently visualized and interpreted imaging which showed No evidence for ovarian torsion. 2. Hyperechoic focal area in the right ovary measuring 1.3 cm. This may represent a hemorrhagic cyst, endometrioma, or dermoid. Follow-up ultrasound in 6-12 weeks is recommended to ensure resolution. 3. Simple cyst in the  left ovary measuring 2.1 cm. 4. Normal appearance of the uterus and endometrium. I agree with the radiologist interpretation   Problem List / ED Course:  Patient is a 33 year old female who presented to the emergency department concerns of groin pain.  She reports that she began experience left-sided groin pain about 1 week ago.  Also endorsing some dysuria and increased urinary frequency with denies any vaginal complaints.  States that she is sexually active with 2 female partners and has some concerns for STIs although she denies any vaginal discharge, discomfort, or pain.  No dysuria. On exam, patient has some mild tenderness in the left pelvis with no obvious swelling.  Abdomen is nontender.  Will workup with labs as well as ultrasound of the pelvis to rule out ovarian torsion or other concerning abnormality.  Patient symptomatically stable and does not feel that she needs any medications at this time. Labs are unremarkable.  No evidence of severe infection with white count of 4.2.  UA with concerns for possible UTI as there is bacteria and 0-5 white cells seen as well as some trace hemoglobin.  Given lack of flank tenderness, doubt  pyelonephritis or urolithiasis. On further discussion with patient, she would prefer to wait for results of STI testing versus prophylactic treatment.  Will treat instead for suspected UTI.  Patient with allergy to penicillins and is ever taken Keflex, will discharge with prescription for Macrobid  instead.  Advise return precautions such as positive findings on gonorrhea/chlamydia or HIV testing.  Encourage patient to follow-up with PCP for further management.  No other acute or focal concerns at this time patient discharged home in stable condition.   Social Determinants of Health:  High risk sexual behavior  Final Clinical Impression(s) / ED Diagnoses Final diagnoses:  Acute cystitis without hematuria  Cysts of both ovaries  Screening examination for STI    Rx / DC Orders ED Discharge Orders          Ordered    nitrofurantoin , macrocrystal-monohydrate, (MACROBID ) 100 MG capsule  2 times daily        01/06/23 1824              Kynadi Dragos A, PA-C 01/06/23 1829    Elnor Bernarda SQUIBB, DO 01/09/23 [phone]

## 2023-01-06 NOTE — ED Notes (Signed)
 Patient transported to [place]

## 2023-01-06 NOTE — ED Notes (Signed)
Patient transported to [place] via stretcher.

## 2023-01-09 LAB — GC/CHLAMYDIA PROBE AMP (~~LOC~~) NOT AT ARMC
Chlamydia: NEGATIVE
Comment: NEGATIVE
Comment: NORMAL
Neisseria Gonorrhea: NEGATIVE

## 2023-08-13 ENCOUNTER — Encounter (HOSPITAL_BASED_OUTPATIENT_CLINIC_OR_DEPARTMENT_OTHER): Payer: Self-pay | Admitting: Emergency Medicine

## 2023-08-13 ENCOUNTER — Emergency Department (HOSPITAL_BASED_OUTPATIENT_CLINIC_OR_DEPARTMENT_OTHER)
Admission: EM | Admit: 2023-08-13 | Discharge: 2023-08-13 | Disposition: A | Discharge status: Home / Self Care | Attending: Emergency Medicine | Admitting: Emergency Medicine

## 2023-08-13 ENCOUNTER — Other Ambulatory Visit: Payer: Self-pay

## 2023-08-13 DIAGNOSIS — Z202 Contact with and (suspected) exposure to infections with a predominantly sexual mode of transmission: Secondary | ICD-10-CM | POA: Insufficient documentation

## 2023-08-13 DIAGNOSIS — Z711 Person with feared health complaint in whom no diagnosis is made: Secondary | ICD-10-CM

## 2023-08-13 LAB — PREGNANCY, URINE: Preg Test, Ur: NEGATIVE

## 2023-08-13 LAB — WET PREP, GENITAL
Clue Cells Wet Prep HPF POC: NONE SEEN
Sperm: NONE SEEN
Trich, Wet Prep: NONE SEEN
WBC, Wet Prep HPF POC: <10 (ref <10)
Yeast Wet Prep HPF POC: NONE SEEN

## 2023-08-13 NOTE — ED Provider Notes (Signed)
 [place] [facility] Provider Note   CSN: [id number] Arrival date & time: 08/13/23  2017     Patient presents with: Exposure to STD   Kathryn Daniels is a 33 y.o. female here for evaluation of concern for STD. Recently was sexually active with a previous partner. No abdominal pain, discharge, dysuria or hematuria.  Did have some flulike symptoms about a month ago with concern for HIV.  No unintentional weight loss, nightsweats.   HPI     Prior to Admission medications   Medication Sig Start Date End Date Taking? Authorizing Provider  doxycycline  (VIBRA -TABS) 100 MG tablet Take 1 tablet (100 mg total) by mouth 2 (two) times daily. 09/10/22   Small, Brooke L, PA  FLUoxetine (PROZAC) 20 MG capsule Take 20 mg by mouth daily.    [provider]  pantoprazole  (PROTONIX ) 40 MG tablet Take 1 tablet (40 mg total) by mouth daily. 05/09/17   Raford Lenis, MD  traMADol  (ULTRAM ) 50 MG tablet Take 1 tablet (50 mg total) by mouth every 6 (six) hours as needed. 05/09/17   Raford Lenis, MD    Allergies: Penicillins    Review of Systems  Constitutional: Negative.   HENT: Negative.    Respiratory: Negative.    Cardiovascular: Negative.   Gastrointestinal: Negative.   Genitourinary: Negative.   Musculoskeletal: Negative.   Skin: Negative.   Neurological: Negative.   All other systems reviewed and are negative.   Updated Vital Signs BP (!) 143/86 (BP Location: Left Arm)   Pulse (!) 50   Temp 99 F (37.2 C) (Oral)   Resp 14   Ht 6' 1 (1.854 m)   Wt (!) 160.1 kg   LMP 07/17/2023 (Exact Date)   SpO2 100%   BMI 46.57 kg/m   Physical Exam Vitals and nursing note reviewed.  Constitutional:      General: She is not in acute distress.    Appearance: She is well-developed. She is not ill-appearing.  HENT:     Head: Atraumatic.  Eyes:     Pupils: Pupils are equal, round, and reactive to light.  Cardiovascular:     Rate and Rhythm: Normal rate.   Pulmonary:     Effort: No respiratory distress.  Abdominal:     General: There is no distension.  Musculoskeletal:        General: Normal range of motion.     Cervical back: Normal range of motion.  Skin:    General: Skin is warm and dry.  Neurological:     General: No focal deficit present.     Mental Status: She is alert.  Psychiatric:        Mood and Affect: Mood normal.    (all labs ordered are listed, but only abnormal results are displayed) Labs Reviewed  WET PREP, GENITAL  PREGNANCY, URINE  RPR  HIV ANTIBODY (ROUTINE TESTING W REFLEX)  GC/CHLAMYDIA PROBE AMP ([place]) NOT AT [facility]    EKG: None  Radiology: No results found.   Procedures   Medications Ordered in the ED - No data to display  33 yo here for evaluation of concern for STD.  Currently asymptomatic.  Will plan on HIV, syphilis, gonorrhea, chlamydia testing.  Will follow-up with results on MyChart.  She will inform her partner if anything is positive.  Labs personally viewed and interpreted:  Pregnancy test negative Wet prep negative  Will have her follow-up outpatient, return for any worsening symptoms  The patient has  been appropriately medically screened and/or stabilized in the ED. I have low suspicion for any other emergent medical condition which would require further screening, evaluation or treatment in the ED or require inpatient management.  Patient is hemodynamically stable and in no acute distress.  Patient able to ambulate in department prior to ED.  Evaluation does not show acute pathology that would require ongoing or additional emergent interventions while in the emergency department or further inpatient treatment.  I have discussed the diagnosis with the patient and answered all questions.  Pain is been managed while in the emergency department and patient has no further complaints prior to discharge.  Patient is comfortable with plan discussed in room and is stable for discharge at  this time.  I have discussed strict return precautions for returning to the emergency department.  Patient was encouraged to follow-up with PCP/specialist refer to at discharge.                                     Medical Decision Making Amount and/or Complexity of Data Reviewed External Data Reviewed: labs and notes. Labs: ordered. Decision-making details documented in ED Course.  Risk Decision regarding hospitalization. Diagnosis or treatment significantly limited by social determinants of health.        Final diagnoses:  Concern about STD in female without diagnosis    ED Discharge Orders     None          Melvin Whiteford A, PA-C 08/13/23 2229    Mannie Pac T, DO 08/16/23 [phone]

## 2023-08-13 NOTE — Discharge Instructions (Signed)
 It was a pleasure taking care of you here today  Your lab work thus far is reassuring.  You do have pending HIV, syphilis, gonorrhea chlamydia testing.  If positive you will be called you will need to inform your partners and seek treatment at that time.  Recommend abstaining from sexual intercourse until your testing has returned.  Return for any worsening symptoms.

## 2023-08-13 NOTE — ED Triage Notes (Signed)
 Pt reports wanting an STI check, specifically for HIV, denies any unusual vaginal symptoms; did have some flulike symptoms a month ago

## 2023-08-14 LAB — HIV ANTIBODY (ROUTINE TESTING W REFLEX): HIV Screen 4th Generation wRfx: NONREACTIVE

## 2023-08-14 LAB — RPR: RPR Ser Ql: NONREACTIVE

## 2023-08-15 LAB — GC/CHLAMYDIA PROBE AMP (~~LOC~~) NOT AT ARMC
Chlamydia: NEGATIVE
Comment: NEGATIVE
Comment: NORMAL
Neisseria Gonorrhea: NEGATIVE

## 2023-11-16 ENCOUNTER — Other Ambulatory Visit: Payer: Self-pay

## 2023-11-16 ENCOUNTER — Encounter (HOSPITAL_BASED_OUTPATIENT_CLINIC_OR_DEPARTMENT_OTHER): Payer: Self-pay | Admitting: Emergency Medicine

## 2023-11-16 ENCOUNTER — Emergency Department (HOSPITAL_BASED_OUTPATIENT_CLINIC_OR_DEPARTMENT_OTHER)
Admission: EM | Admit: 2023-11-16 | Discharge: 2023-11-16 | Discharge status: Against Medical Advice | Attending: Emergency Medicine | Admitting: Emergency Medicine

## 2023-11-16 DIAGNOSIS — Z5321 Procedure and treatment not carried out due to patient leaving prior to being seen by health care provider: Secondary | ICD-10-CM | POA: Diagnosis not present

## 2023-11-16 DIAGNOSIS — A64 Unspecified sexually transmitted disease: Secondary | ICD-10-CM | POA: Insufficient documentation

## 2023-11-16 LAB — URINALYSIS, ROUTINE W REFLEX MICROSCOPIC
Bilirubin Urine: NEGATIVE
Glucose, UA: NEGATIVE mg/dL
Ketones, ur: NEGATIVE mg/dL
Leukocytes,Ua: NEGATIVE
Nitrite: NEGATIVE
Protein, ur: NEGATIVE mg/dL
Specific Gravity, Urine: >=1.03 (ref 1.005–1.030)
pH: 6 (ref 5.0–8.0)

## 2023-11-16 LAB — URINALYSIS, MICROSCOPIC (REFLEX): WBC, UA: NONE SEEN WBC/hpf (ref 0–5)

## 2023-11-16 LAB — PREGNANCY, URINE: Preg Test, Ur: NEGATIVE

## 2023-11-16 NOTE — ED Triage Notes (Signed)
 Pt reports vaginal discomfort and discharge with itching.  Feels things are abnormal  requesting STI screening.

## 2023-11-20 LAB — GC/CHLAMYDIA PROBE AMP (~~LOC~~) NOT AT ARMC
Chlamydia: NEGATIVE
Comment: NEGATIVE
Comment: NORMAL
Neisseria Gonorrhea: NEGATIVE
